# Patient Record
Sex: Male | Born: 1955 | Race: White | Hispanic: No | Marital: Single
Health system: Southern US, Community
[De-identification: ages and names within clinical notes are randomized; demographics above are authoritative.]

## PROBLEM LIST (undated history)

## (undated) DIAGNOSIS — N183 Chronic kidney disease, stage 3 unspecified: Secondary | ICD-10-CM

## (undated) DIAGNOSIS — J9621 Acute and chronic respiratory failure with hypoxia: Secondary | ICD-10-CM

## (undated) DIAGNOSIS — R6520 Severe sepsis without septic shock: Secondary | ICD-10-CM

## (undated) DIAGNOSIS — G9340 Encephalopathy, unspecified: Secondary | ICD-10-CM

## (undated) DIAGNOSIS — A419 Sepsis, unspecified organism: Secondary | ICD-10-CM

---

## 2019-05-14 ENCOUNTER — Inpatient Hospital Stay
Admit: 2019-05-14 | Discharge: 2019-06-29 | Disposition: E | Payer: Medicare Other | Source: Other Acute Inpatient Hospital | Attending: Internal Medicine | Admitting: Internal Medicine

## 2019-05-14 DIAGNOSIS — N183 Chronic kidney disease, stage 3 unspecified: Secondary | ICD-10-CM | POA: Diagnosis present

## 2019-05-14 DIAGNOSIS — Z4659 Encounter for fitting and adjustment of other gastrointestinal appliance and device: Secondary | ICD-10-CM

## 2019-05-14 DIAGNOSIS — Z9289 Personal history of other medical treatment: Secondary | ICD-10-CM

## 2019-05-14 DIAGNOSIS — J9621 Acute and chronic respiratory failure with hypoxia: Secondary | ICD-10-CM | POA: Diagnosis present

## 2019-05-14 DIAGNOSIS — R6520 Severe sepsis without septic shock: Secondary | ICD-10-CM | POA: Diagnosis present

## 2019-05-14 DIAGNOSIS — Z9911 Dependence on respirator [ventilator] status: Secondary | ICD-10-CM

## 2019-05-14 DIAGNOSIS — J189 Pneumonia, unspecified organism: Secondary | ICD-10-CM

## 2019-05-14 DIAGNOSIS — Z9889 Other specified postprocedural states: Secondary | ICD-10-CM

## 2019-05-14 DIAGNOSIS — N179 Acute kidney failure, unspecified: Secondary | ICD-10-CM

## 2019-05-14 DIAGNOSIS — G9340 Encephalopathy, unspecified: Secondary | ICD-10-CM | POA: Diagnosis present

## 2019-05-14 DIAGNOSIS — A419 Sepsis, unspecified organism: Secondary | ICD-10-CM | POA: Diagnosis present

## 2019-05-14 DIAGNOSIS — J111 Influenza due to unidentified influenza virus with other respiratory manifestations: Secondary | ICD-10-CM

## 2019-05-14 DIAGNOSIS — J9690 Respiratory failure, unspecified, unspecified whether with hypoxia or hypercapnia: Secondary | ICD-10-CM

## 2019-05-14 HISTORY — DX: Sepsis, unspecified organism: R65.20

## 2019-05-14 HISTORY — DX: Encephalopathy, unspecified: G93.40

## 2019-05-14 HISTORY — DX: Chronic kidney disease, stage 3 unspecified: N18.30

## 2019-05-14 HISTORY — DX: Sepsis, unspecified organism: A41.9

## 2019-05-14 HISTORY — DX: Acute and chronic respiratory failure with hypoxia: J96.21

## 2019-05-14 LAB — BLOOD GAS, ARTERIAL
Acid-base deficit: 2.9 mmol/L — ABNORMAL HIGH (ref 0.0–2.0)
Bicarbonate: 22 mmol/L (ref 20.0–28.0)
FIO2: 40
O2 Saturation: 98 %
Patient temperature: 37
pCO2 arterial: 41.8 mmHg (ref 32.0–48.0)
pH, Arterial: 7.341 — ABNORMAL LOW (ref 7.350–7.450)
pO2, Arterial: 109 mmHg — ABNORMAL HIGH (ref 83.0–108.0)

## 2019-05-15 ENCOUNTER — Other Ambulatory Visit (HOSPITAL_COMMUNITY): Payer: Medicare Other

## 2019-05-15 ENCOUNTER — Encounter: Payer: Self-pay | Admitting: Internal Medicine

## 2019-05-15 DIAGNOSIS — G9340 Encephalopathy, unspecified: Secondary | ICD-10-CM | POA: Diagnosis not present

## 2019-05-15 DIAGNOSIS — N183 Chronic kidney disease, stage 3 unspecified: Secondary | ICD-10-CM

## 2019-05-15 DIAGNOSIS — J9621 Acute and chronic respiratory failure with hypoxia: Secondary | ICD-10-CM | POA: Diagnosis not present

## 2019-05-15 DIAGNOSIS — R6520 Severe sepsis without septic shock: Secondary | ICD-10-CM

## 2019-05-15 DIAGNOSIS — A419 Sepsis, unspecified organism: Secondary | ICD-10-CM | POA: Diagnosis present

## 2019-05-15 LAB — COMPREHENSIVE METABOLIC PANEL
ALT: 24 U/L (ref 0–44)
AST: 27 U/L (ref 15–41)
Albumin: 1.8 g/dL — ABNORMAL LOW (ref 3.5–5.0)
Alkaline Phosphatase: 72 U/L (ref 38–126)
Anion gap: 9 (ref 5–15)
BUN: 61 mg/dL — ABNORMAL HIGH (ref 8–23)
CO2: 22 mmol/L (ref 22–32)
Calcium: 7.6 mg/dL — ABNORMAL LOW (ref 8.9–10.3)
Chloride: 110 mmol/L (ref 98–111)
Creatinine, Ser: 1.89 mg/dL — ABNORMAL HIGH (ref 0.61–1.24)
GFR calc Af Amer: 42 mL/min — ABNORMAL LOW (ref >60)
GFR calc non Af Amer: 36 mL/min — ABNORMAL LOW (ref >60)
Glucose, Bld: 65 mg/dL — ABNORMAL LOW (ref 70–99)
Potassium: 3.7 mmol/L (ref 3.5–5.1)
Sodium: 141 mmol/L (ref 135–145)
Total Bilirubin: 0.4 mg/dL (ref 0.3–1.2)
Total Protein: 5.4 g/dL — ABNORMAL LOW (ref 6.5–8.1)

## 2019-05-15 LAB — CBC
HCT: 28.2 % — ABNORMAL LOW (ref 39.0–52.0)
Hemoglobin: 8.9 g/dL — ABNORMAL LOW (ref 13.0–17.0)
MCH: 30.1 pg (ref 26.0–34.0)
MCHC: 31.6 g/dL (ref 30.0–36.0)
MCV: 95.3 fL (ref 80.0–100.0)
Platelets: 231 10*3/uL (ref 150–400)
RBC: 2.96 MIL/uL — ABNORMAL LOW (ref 4.22–5.81)
RDW: 16 % — ABNORMAL HIGH (ref 11.5–15.5)
WBC: 12.3 10*3/uL — ABNORMAL HIGH (ref 4.0–10.5)
nRBC: 0 % (ref 0.0–0.2)

## 2019-05-15 LAB — HEMOGLOBIN A1C
Hgb A1c MFr Bld: 9.5 % — ABNORMAL HIGH (ref 4.8–5.6)
Mean Plasma Glucose: 225.95 mg/dL

## 2019-05-15 MED ORDER — GENERIC EXTERNAL MEDICATION
2000.00 | Status: DC
Start: 2019-05-14 — End: 2019-05-15

## 2019-05-15 MED ORDER — ATORVASTATIN CALCIUM 40 MG PO TABS
40.00 | ORAL_TABLET | ORAL | Status: DC
Start: 2019-05-14 — End: 2019-05-15

## 2019-05-15 MED ORDER — ACETAMINOPHEN 160 MG/5ML PO SOLN
650.00 | ORAL | Status: DC
Start: 2019-05-14 — End: 2019-05-15

## 2019-05-15 MED ORDER — GLUCOSE-VITAMIN C 4-6 GM-MG PO CHEW
12.00 | CHEWABLE_TABLET | ORAL | Status: DC
Start: ? — End: 2019-05-15

## 2019-05-15 MED ORDER — SODIUM CHLORIDE 0.9 % IV SOLN
INTRAVENOUS | Status: DC
Start: ? — End: 2019-05-15

## 2019-05-15 MED ORDER — FERROUS SULFATE 325 (65 FE) MG PO TABS
325.00 | ORAL_TABLET | ORAL | Status: DC
Start: 2019-05-14 — End: 2019-05-15

## 2019-05-15 MED ORDER — INSULIN NPH (HUMAN) (ISOPHANE) 100 UNIT/ML ~~LOC~~ SUSP
40.00 | SUBCUTANEOUS | Status: DC
Start: 2019-05-14 — End: 2019-05-15

## 2019-05-15 MED ORDER — NICOTINE 7 MG/24HR TD PT24
1.00 | MEDICATED_PATCH | TRANSDERMAL | Status: DC
Start: 2019-05-15 — End: 2019-05-15

## 2019-05-15 MED ORDER — ACETAMINOPHEN 325 MG PO TABS
650.00 | ORAL_TABLET | ORAL | Status: DC
Start: ? — End: 2019-05-15

## 2019-05-15 MED ORDER — PROPOFOL 100 MG/10ML IV EMUL
5.00 | INTRAVENOUS | Status: DC
Start: ? — End: 2019-05-15

## 2019-05-15 MED ORDER — HYDRALAZINE HCL 25 MG PO TABS
25.00 | ORAL_TABLET | ORAL | Status: DC
Start: 2019-05-14 — End: 2019-05-15

## 2019-05-15 MED ORDER — CLINDAMYCIN PHOSPHATE IN D5W 600 MG/50ML IV SOLN
600.00 | INTRAVENOUS | Status: DC
Start: 2019-05-14 — End: 2019-05-15

## 2019-05-15 MED ORDER — GENERIC EXTERNAL MEDICATION
50.00 | Status: DC
Start: ? — End: 2019-05-15

## 2019-05-15 MED ORDER — GENERIC EXTERNAL MEDICATION
Status: DC
Start: ? — End: 2019-05-15

## 2019-05-15 MED ORDER — SENNOSIDES-DOCUSATE SODIUM 8.6-50 MG PO TABS
1.00 | ORAL_TABLET | ORAL | Status: DC
Start: 2019-05-14 — End: 2019-05-15

## 2019-05-15 MED ORDER — GENERIC EXTERNAL MEDICATION
5.00 | Status: DC
Start: ? — End: 2019-05-15

## 2019-05-15 MED ORDER — VECURONIUM BROMIDE 10 MG IV SOLR
10.00 | INTRAVENOUS | Status: DC
Start: ? — End: 2019-05-15

## 2019-05-15 MED ORDER — GENERIC EXTERNAL MEDICATION
4000.00 | Status: DC
Start: ? — End: 2019-05-15

## 2019-05-15 MED ORDER — SODIUM CHLORIDE 0.9 % IV SOLN
20.00 | INTRAVENOUS | Status: DC
Start: ? — End: 2019-05-15

## 2019-05-15 MED ORDER — GENERIC EXTERNAL MEDICATION
1.00 | Status: DC
Start: ? — End: 2019-05-15

## 2019-05-15 MED ORDER — ASPIRIN 81 MG PO CHEW
81.00 | CHEWABLE_TABLET | ORAL | Status: DC
Start: 2019-05-15 — End: 2019-05-15

## 2019-05-15 MED ORDER — ONDANSETRON HCL 4 MG/2ML IJ SOLN
4.00 | INTRAMUSCULAR | Status: DC
Start: ? — End: 2019-05-15

## 2019-05-15 MED ORDER — NOREPINEPHRINE 4 MG/250ML-% IV SOLN
0.02 | INTRAVENOUS | Status: DC
Start: ? — End: 2019-05-15

## 2019-05-15 MED ORDER — DSS 100 MG PO CAPS
100.00 | ORAL_CAPSULE | ORAL | Status: DC
Start: 2019-05-14 — End: 2019-05-15

## 2019-05-15 MED ORDER — ALUM & MAG HYDROXIDE-SIMETH 200-200-20 MG/5ML PO SUSP
30.00 | ORAL | Status: DC
Start: ? — End: 2019-05-15

## 2019-05-15 MED ORDER — PROMETHAZINE HCL 25 MG PO TABS
12.50 | ORAL_TABLET | ORAL | Status: DC
Start: ? — End: 2019-05-15

## 2019-05-15 MED ORDER — PRO-STAT PO LIQD
30.00 | ORAL | Status: DC
Start: 2019-05-14 — End: 2019-05-15

## 2019-05-15 MED ORDER — CLOPIDOGREL BISULFATE 75 MG PO TABS
75.00 | ORAL_TABLET | ORAL | Status: DC
Start: 2019-05-15 — End: 2019-05-15

## 2019-05-15 MED ORDER — IPRATROPIUM-ALBUTEROL 0.5-2.5 (3) MG/3ML IN SOLN
3.00 | RESPIRATORY_TRACT | Status: DC
Start: 2019-05-14 — End: 2019-05-15

## 2019-05-15 MED ORDER — METHYLPREDNISOLONE SODIUM SUCC 40 MG IJ SOLR
40.00 | INTRAMUSCULAR | Status: DC
Start: 2019-05-14 — End: 2019-05-15

## 2019-05-15 MED ORDER — HYDRALAZINE HCL 25 MG PO TABS
25.00 | ORAL_TABLET | ORAL | Status: DC
Start: ? — End: 2019-05-15

## 2019-05-15 MED ORDER — MULTI-VITAMIN PO TABS
1.00 | ORAL_TABLET | ORAL | Status: DC
Start: 2019-05-15 — End: 2019-05-15

## 2019-05-15 MED ORDER — ATROPINE SULFATE 0.5 MG/5ML IJ SOSY
0.50 | PREFILLED_SYRINGE | INTRAMUSCULAR | Status: DC
Start: ? — End: 2019-05-15

## 2019-05-15 MED ORDER — MAGNESIUM SULFATE 4 GM/100ML IV SOLN
4.00 | INTRAVENOUS | Status: DC
Start: ? — End: 2019-05-15

## 2019-05-15 MED ORDER — CHLORHEXIDINE GLUCONATE 0.12 % MT SOLN
15.00 | OROMUCOSAL | Status: DC
Start: 2019-05-14 — End: 2019-05-15

## 2019-05-15 MED ORDER — LACTATED RINGERS IV SOLN
500.00 | INTRAVENOUS | Status: DC
Start: ? — End: 2019-05-15

## 2019-05-15 MED ORDER — LORAZEPAM 2 MG/ML IJ SOLN
2.00 | INTRAMUSCULAR | Status: DC
Start: ? — End: 2019-05-15

## 2019-05-15 MED ORDER — IPRATROPIUM-ALBUTEROL 0.5-2.5 (3) MG/3ML IN SOLN
3.00 | RESPIRATORY_TRACT | Status: DC
Start: ? — End: 2019-05-15

## 2019-05-15 MED ORDER — ENOXAPARIN SODIUM 40 MG/0.4ML ~~LOC~~ SOLN
40.00 | SUBCUTANEOUS | Status: DC
Start: 2019-05-15 — End: 2019-05-15

## 2019-05-15 MED ORDER — EPINEPHRINE 1 MG/10ML IJ SOSY
1.00 | PREFILLED_SYRINGE | INTRAMUSCULAR | Status: DC
Start: ? — End: 2019-05-15

## 2019-05-15 MED ORDER — AMLODIPINE BESYLATE 5 MG PO TABS
5.00 | ORAL_TABLET | ORAL | Status: DC
Start: 2019-05-15 — End: 2019-05-15

## 2019-05-15 MED ORDER — FENTANYL CITRATE (PF) 50 MCG/ML IJ SOLN
100.00 | INTRAMUSCULAR | Status: DC
Start: ? — End: 2019-05-15

## 2019-05-15 MED ORDER — PANTOPRAZOLE SODIUM 40 MG IV SOLR
40.00 | INTRAVENOUS | Status: DC
Start: 2019-05-15 — End: 2019-05-15

## 2019-05-15 MED ORDER — DEXTROSE 50 % IV SOLN
25.00 | INTRAVENOUS | Status: DC
Start: ? — End: 2019-05-15

## 2019-05-15 MED ORDER — COLLAGENASE 250 UNIT/GM EX OINT
TOPICAL_OINTMENT | CUTANEOUS | Status: DC
Start: 2019-05-15 — End: 2019-05-15

## 2019-05-15 MED ORDER — L-LYSINE EX OINT
TOPICAL_OINTMENT | CUTANEOUS | Status: DC
Start: ? — End: 2019-05-15

## 2019-05-15 MED ORDER — CARBOXYMETHYLCELLULOSE SODIUM 0.5 % OP SOLN
2.00 | OPHTHALMIC | Status: DC
Start: 2019-05-14 — End: 2019-05-15

## 2019-05-15 MED ORDER — INSULIN ASPART 100 UNIT/ML FLEXPEN
0.00 | PEN_INJECTOR | SUBCUTANEOUS | Status: DC
Start: 2019-05-14 — End: 2019-05-15

## 2019-05-15 NOTE — Consult Note (Signed)
Pulmonary [place]  Date of Service: 05/15/2019  PULMONARY CRITICAL CARE CONSULT   WM SAHAGUN  MGQ:[id number]  DOB: 05-15-1956   DOA: 2019-05-25  Referring Physician: Merton Border, MD  HPI: James Merritt is a 63 y.o. male seen for follow up of Acute on Chronic Respiratory Failure.  Patient has multiple problems including anxiety disorder COPD diabetes GERD gout chronic renal failure stage III hepatitis C presented to the hospital because of pain and swelling in the hands.  Patient had a long complicated course at the other facility basically was found to have possibly subcutaneous abscess.  There was significant edema noted.  He was treated for upper extremity cellulitis with antibiotics.  Given vancomycin and clindamycin.  Patient required about 4 to 6 weeks of antibiotics and is still on antibiotics because of high risk of osteomyelitis.  Patient ended up being intubated on December 14 because of respiratory failure it was felt to be secondary to aspiration.  Apparently FiO2 was improved and his oxygen went from 100% down to 40%.  Patient is now transferred to our facility for further management currently is on pressure support mode has been on 40% FiO2 with pressure support 12/5 and is orally intubated.  Review of Systems:  ROS performed and is unremarkable other than noted above.  PAST MEDICAL HISTORY Past Medical History:  Diagnosis Date  . Anxiety  . Chronic renal impairment, stage 3 (moderate) ([place]) 12/14/2017  . COPD (chronic obstructive pulmonary disease) ([place])  . Diabetes mellitus ([place])  . GERD (gastroesophageal reflux disease)  . Gout  . Hepatitis C antibody test positive 01/06/2019  04/19/2018  . 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 Gated images reveal LVEF = 32%. This represents a abnormal functional finding. This is a high risk study due to LVEF   . Hypertension  . Seizures ([place])    PAST SURGICAL HISTORY Past Surgical History:  Procedure Laterality Date  . HX CORONARY ARTERY BYPASS GRAFT 11/2017  . HX FOOT SURGERY  . HX HAND LIGAMENT RECONSTRUCTION  . HX KNEE CARTILAGE SURGERY  . HX PTCA   ALLERGIES Allergies  Allergen Reactions  . Clonidine Unknown  Other reaction(s): Unknown Pt denies allergy Pt denies allergy  . Methadone Unknown  Other reaction(s): Unknown Pt denies allergy Pt denies allergy     Medications: Reviewed on Rounds  Physical Exam:  Vitals: Temperature 96.1 pulse 56 respiratory rate 14 blood pressure 141/61 saturations 100%  Ventilator Settings patient is orally intubated critically ill right now is on pressure support FiO2 40% tidal line 580 pressure poor 12 PEEP 5  . General: Comfortable at this time . Eyes: Grossly normal lids, irises & conjunctiva . ENT: grossly tongue is normal . Neck: no obvious mass . Cardiovascular: S1-S2 normal no gallop or rub . Respiratory: Scattered coarse breath sounds are noted bilaterally . Abdomen: Soft and nontender at this time . Skin: no rash seen on limited exam . Musculoskeletal: not rigid . Psychiatric:unable to assess . Neurologic: no seizure no involuntary movements         Labs on Admission:  Basic Metabolic Panel: Recent Labs  Lab 05/15/19 0031  NA 141  K 3.7  CL 110  CO2 22  GLUCOSE 65*  BUN 61*  CREATININE 1.89*  CALCIUM  7.6*    Recent Labs  Lab 04/28/2019 2235  PHART 7.341*  PCO2ART 41.8  PO2ART 109*  HCO3 22.0  O2SAT 98.0    Liver Function Tests: Recent Labs  Lab 05/15/19 0031  AST 27  ALT 24  ALKPHOS 72  BILITOT 0.4  PROT 5.4*  ALBUMIN 1.8*   No results for input(s): LIPASE, AMYLASE in the last 168 hours. No results for input(s): AMMONIA in the last 168  hours.  CBC: Recent Labs  Lab 05/15/19 0031  WBC 12.3*  HGB 8.9*  HCT 28.2*  MCV 95.3  PLT 231    Cardiac Enzymes: No results for input(s): CKTOTAL, CKMB, CKMBINDEX, TROPONINI in the last 168 hours.  BNP (last 3 results) No results for input(s): BNP in the last 8760 hours.  ProBNP (last 3 results) No results for input(s): PROBNP in the last 8760 hours.   Radiological Exams on Admission: DG Chest Port 1 View  Result Date: 05/15/2019 CLINICAL DATA:  Pneumonia. NG tube placement. EXAM: PORTABLE CHEST 1 VIEW COMPARISON:  None. FINDINGS: Endotracheal tube tip 3.8 cm from the carina. Enteric tube in place with tip below the diaphragm not included in the field of view. Post median sternotomy. Upper normal heart size. Hazy bilateral lung base opacities likely combination of pleural effusions and airspace disease. Focal opacity in the left mid lung. No pneumothorax. Remote right clavicle fracture. Multiple overlying monitoring devices. IMPRESSION: 1. Endotracheal tube tip 3.8 cm from the carina. 2. Enteric tube in place with tip below the diaphragm not included in the field of view. 3. Hazy bilateral lung base opacities likely combination of pleural effusions and airspace disease. Additional focal opacity in the left mid lung suspicious for pneumonia. Electronically Signed   By: Narda Rutherford M.D.   On: 05/15/2019 01:12   DG Abd Portable 1V  Result Date: 05/15/2019 CLINICAL DATA:  NG tube placement. EXAM: PORTABLE ABDOMEN - 1 VIEW COMPARISON:  None. FINDINGS: Tip of the weighted enteric tube in the right upper quadrant likely in the first portion of the duodenum. Nonobstructive bowel gas pattern. Small amount of air in nondilated colon. IMPRESSION: Tip of the weighted enteric tube in the right upper quadrant likely in the first portion of the duodenum. Electronically Signed   By: Narda Rutherford M.D.   On: 05/15/2019 01:13    Assessment/Plan Active Problems:   Acute on chronic  respiratory failure with hypoxia (HCC)   Severe sepsis (HCC)   Chronic kidney disease, stage III (moderate)   Acute encephalopathy   1. Acute on chronic respiratory failure hypoxia at this time patient is on the wean protocol on pressure support 12/5.  He was just recently intubated looks like on the 14th so has been on the ventilator for 4 days hopefully we can try to aggressively wean and possibly extubate otherwise if he fails will more than likely need a tracheostomy. 2. Severe sepsis patient apparently had positive blood cultures noted to steroids has been treated patient was on vancomycin clindamycin and will be needing a prolonged course of antibiotics. 3. Acute renal failure patient apparently has a baseline stage III chronic renal failure and his renal function was 3.3 upon admission at the other facility currently creatinine is 1.89 we are going to monitor the fluid status closely and make certain that there is no worsening of the renal function. 4. Acute encephalopathy unclear etiology patient does have issues with substance abuse in the past so we will need to monitor closely for any withdrawal  seizures etc. presented  I have personally seen and evaluated the patient, evaluated laboratory and imaging results, formulated the assessment and plan and placed orders.  Patient is critically ill in danger of cardiac arrest and death The Patient requires high complexity decision making for assessment and support.  Case was discussed on Rounds with the Respiratory Therapy Staff Time Spent 70minutes  Yevonne PaxSaadat A Jatavion Peaster, MD [facility] Pulmonary Critical Care Medicine Sleep Medicine

## 2019-05-16 DIAGNOSIS — N183 Chronic kidney disease, stage 3 unspecified: Secondary | ICD-10-CM | POA: Diagnosis not present

## 2019-05-16 DIAGNOSIS — A419 Sepsis, unspecified organism: Secondary | ICD-10-CM | POA: Diagnosis not present

## 2019-05-16 DIAGNOSIS — G9340 Encephalopathy, unspecified: Secondary | ICD-10-CM | POA: Diagnosis not present

## 2019-05-16 DIAGNOSIS — J9621 Acute and chronic respiratory failure with hypoxia: Secondary | ICD-10-CM | POA: Diagnosis not present

## 2019-05-16 LAB — HEMOGLOBIN A1C
Hgb A1c MFr Bld: 9.3 % — ABNORMAL HIGH (ref 4.8–5.6)
Mean Plasma Glucose: 220.21 mg/dL

## 2019-05-16 LAB — BASIC METABOLIC PANEL
Anion gap: 10 (ref 5–15)
BUN: 60 mg/dL — ABNORMAL HIGH (ref 8–23)
CO2: 21 mmol/L — ABNORMAL LOW (ref 22–32)
Calcium: 7.7 mg/dL — ABNORMAL LOW (ref 8.9–10.3)
Chloride: 109 mmol/L (ref 98–111)
Creatinine, Ser: 1.73 mg/dL — ABNORMAL HIGH (ref 0.61–1.24)
GFR calc Af Amer: 47 mL/min — ABNORMAL LOW (ref >60)
GFR calc non Af Amer: 41 mL/min — ABNORMAL LOW (ref >60)
Glucose, Bld: 195 mg/dL — ABNORMAL HIGH (ref 70–99)
Potassium: 3.6 mmol/L (ref 3.5–5.1)
Sodium: 140 mmol/L (ref 135–145)

## 2019-05-16 LAB — CBC
HCT: 29.4 % — ABNORMAL LOW (ref 39.0–52.0)
Hemoglobin: 9.3 g/dL — ABNORMAL LOW (ref 13.0–17.0)
MCH: 30.2 pg (ref 26.0–34.0)
MCHC: 31.6 g/dL (ref 30.0–36.0)
MCV: 95.5 fL (ref 80.0–100.0)
Platelets: 166 10*3/uL (ref 150–400)
RBC: 3.08 MIL/uL — ABNORMAL LOW (ref 4.22–5.81)
RDW: 16 % — ABNORMAL HIGH (ref 11.5–15.5)
WBC: 8 10*3/uL (ref 4.0–10.5)
nRBC: 0 % (ref 0.0–0.2)

## 2019-05-16 NOTE — Progress Notes (Addendum)
Pulmonary Critical Care Medicine [place]   PULMONARY CRITICAL CARE SERVICE  PROGRESS NOTE  Date of Service: 05/16/2019  James Merritt  YWV:[id number]  DOB: 12-Jul-1955   DOA: 05/04/2019  Referring Physician: Merton Border, MD  HPI: James Merritt is a 63 y.o. male seen for follow up of Acute on Chronic Respiratory Failure.  Patient remains critically ill orally intubated he was started on a pressure support mode this morning appears to be tolerating it so far we will see how he does through the day.  Medications: Reviewed on Rounds  Physical Exam:  Vitals: Temperature 97.1 pulse 71 respiratory 22 blood pressure 163/83 saturations 98%  Ventilator Settings mode of ventilation pressure support FiO2 35% tidal volume is 528 pressure poor 12 PEEP 5  . General: Comfortable at this time . Eyes: Grossly normal lids, irises & conjunctiva . ENT: grossly tongue is normal . Neck: no obvious mass . Cardiovascular: S1 S2 normal no gallop . Respiratory: Coarse breath sounds are noted bilaterally . Abdomen: soft . Skin: no rash seen on limited exam . Musculoskeletal: not rigid . Psychiatric:unable to assess . Neurologic: no seizure no involuntary movements         Lab Data:   Basic Metabolic Panel: Recent Labs  Lab 05/15/19 0031 05/16/19 0531  NA 141 140  K 3.7 3.6  CL 110 109  CO2 22 21*  GLUCOSE 65* 195*  BUN 61* 60*  CREATININE 1.89* 1.73*  CALCIUM 7.6* 7.7*    ABG: Recent Labs  Lab 05/25/2019 2235  PHART 7.341*  PCO2ART 41.8  PO2ART 109*  HCO3 22.0  O2SAT 98.0    Liver Function Tests: Recent Labs  Lab 05/15/19 0031  AST 27  ALT 24  ALKPHOS 72  BILITOT 0.4  PROT 5.4*  ALBUMIN 1.8*   No results for input(s): LIPASE, AMYLASE in the last 168 hours. No results for input(s): AMMONIA in the last 168 hours.  CBC: Recent Labs  Lab 05/15/19 0031 05/16/19 0531  WBC 12.3* 8.0  HGB 8.9* 9.3*  HCT 28.2* 29.4*  MCV 95.3 95.5  PLT 231  166    Cardiac Enzymes: No results for input(s): CKTOTAL, CKMB, CKMBINDEX, TROPONINI in the last 168 hours.  BNP (last 3 results) No results for input(s): BNP in the last 8760 hours.  ProBNP (last 3 results) No results for input(s): PROBNP in the last 8760 hours.  Radiological Exams: DG Chest Port 1 View  Result Date: 05/15/2019 CLINICAL DATA:  Pneumonia. NG tube placement. EXAM: PORTABLE CHEST 1 VIEW COMPARISON:  None. FINDINGS: Endotracheal tube tip 3.8 cm from the carina. Enteric tube in place with tip below the diaphragm not included in the field of view. Post median sternotomy. Upper normal heart size. Hazy bilateral lung base opacities likely combination of pleural effusions and airspace disease. Focal opacity in the left mid lung. No pneumothorax. Remote right clavicle fracture. Multiple overlying monitoring devices. IMPRESSION: 1. Endotracheal tube tip 3.8 cm from the carina. 2. Enteric tube in place with tip below the diaphragm not included in the field of view. 3. Hazy bilateral lung base opacities likely combination of pleural effusions and airspace disease. Additional focal opacity in the left mid lung suspicious for pneumonia. Electronically Signed   By: Keith Rake M.D.   On: 05/15/2019 01:12   DG Abd Portable 1V  Result Date: 05/15/2019 CLINICAL DATA:  NG tube placement. EXAM: PORTABLE ABDOMEN - 1 VIEW COMPARISON:  None. FINDINGS: Tip of the weighted enteric tube  in the right upper quadrant likely in the first portion of the duodenum. Nonobstructive bowel gas pattern. Small amount of air in nondilated colon. IMPRESSION: Tip of the weighted enteric tube in the right upper quadrant likely in the first portion of the duodenum. Electronically Signed   By: Narda Rutherford M.D.   On: 05/15/2019 01:13    Assessment/Plan Active Problems:   Acute on chronic respiratory failure with hypoxia (HCC)   Severe sepsis (HCC)   Chronic kidney disease, stage III (moderate)   Acute  encephalopathy   1. Acute on chronic respiratory failure with hypoxia patient is on the wean protocol right now on 12/5 pressure support remains orally intubated.  I am hopeful that we can try to wean him aggressively and try to extubate.  If patient's not able to tolerate then will need a tracheostomy. 2. Severe sepsis right now patient's hemodynamics are stable has completed antibiotics at the other facility.  We will continue to monitor for any fever spikes 3. Chronic kidney disease stage III labs currently showing creatinine coming down to 1.7 continue to monitor fluid status closely 4. Acute encephalopathy no changes are noted at this time we will continue with supportive care   I have personally seen and evaluated the patient, evaluated laboratory and imaging results, formulated the assessment and plan and placed orders.  Patient remains critically ill in danger of cardiac arrest and death time 35 minutes The Patient requires high complexity decision making for assessment and support.  Case was discussed on Rounds with the Respiratory Therapy Staff  Yevonne Pax, MD [facility] Pulmonary Critical Care Medicine Sleep Medicine

## 2019-05-17 ENCOUNTER — Other Ambulatory Visit (HOSPITAL_COMMUNITY): Payer: Medicare Other

## 2019-05-17 DIAGNOSIS — G9340 Encephalopathy, unspecified: Secondary | ICD-10-CM | POA: Diagnosis not present

## 2019-05-17 DIAGNOSIS — N183 Chronic kidney disease, stage 3 unspecified: Secondary | ICD-10-CM | POA: Diagnosis not present

## 2019-05-17 DIAGNOSIS — J9621 Acute and chronic respiratory failure with hypoxia: Secondary | ICD-10-CM | POA: Diagnosis not present

## 2019-05-17 DIAGNOSIS — A419 Sepsis, unspecified organism: Secondary | ICD-10-CM | POA: Diagnosis not present

## 2019-05-17 LAB — MAGNESIUM: Magnesium: 2.4 mg/dL (ref 1.7–2.4)

## 2019-05-17 LAB — CBC
HCT: 29.4 % — ABNORMAL LOW (ref 39.0–52.0)
Hemoglobin: 9.4 g/dL — ABNORMAL LOW (ref 13.0–17.0)
MCH: 30 pg (ref 26.0–34.0)
MCHC: 32 g/dL (ref 30.0–36.0)
MCV: 93.9 fL (ref 80.0–100.0)
Platelets: 143 10*3/uL — ABNORMAL LOW (ref 150–400)
RBC: 3.13 MIL/uL — ABNORMAL LOW (ref 4.22–5.81)
RDW: 15.9 % — ABNORMAL HIGH (ref 11.5–15.5)
WBC: 9.6 10*3/uL (ref 4.0–10.5)
nRBC: 0 % (ref 0.0–0.2)

## 2019-05-17 LAB — TROPONIN I (HIGH SENSITIVITY)
Troponin I (High Sensitivity): 19 ng/L — ABNORMAL HIGH (ref <18)
Troponin I (High Sensitivity): 20 ng/L — ABNORMAL HIGH (ref <18)

## 2019-05-17 LAB — BASIC METABOLIC PANEL
Anion gap: 9 (ref 5–15)
BUN: 59 mg/dL — ABNORMAL HIGH (ref 8–23)
CO2: 20 mmol/L — ABNORMAL LOW (ref 22–32)
Calcium: 7.7 mg/dL — ABNORMAL LOW (ref 8.9–10.3)
Chloride: 111 mmol/L (ref 98–111)
Creatinine, Ser: 1.52 mg/dL — ABNORMAL HIGH (ref 0.61–1.24)
GFR calc Af Amer: 55 mL/min — ABNORMAL LOW (ref >60)
GFR calc non Af Amer: 47 mL/min — ABNORMAL LOW (ref >60)
Glucose, Bld: 298 mg/dL — ABNORMAL HIGH (ref 70–99)
Potassium: 4.4 mmol/L (ref 3.5–5.1)
Sodium: 140 mmol/L (ref 135–145)

## 2019-05-17 MED ORDER — GENERIC EXTERNAL MEDICATION
Status: DC
Start: ? — End: 2019-05-17

## 2019-05-17 NOTE — Progress Notes (Signed)
Pulmonary Critical Care Medicine [place]   PULMONARY CRITICAL CARE SERVICE  PROGRESS NOTE  Date of Service: 05/17/2019  James Merritt  [id number]  DOB: 01-18-1956   DOA: 05/12/2019  Referring Physician: Merton Border, MD  HPI: James Merritt is a 63 y.o. male seen for follow up of Acute on Chronic Respiratory Failure.  Patient currently is on pressure support mode has been on 30% FiO2 with good tidal volumes.  Right now is on 12/5  Medications: Reviewed on Rounds  Physical Exam:  Vitals: Temperature 96.8 pulse 57 respiratory 11 blood pressure 123/49 saturations 98%  Ventilator Settings mode of ventilation pressure support FiO2 30% tidal volume 516 pressure poor 12 PEEP 5  . General: Comfortable at this time . Eyes: Grossly normal lids, irises & conjunctiva . ENT: grossly tongue is normal . Neck: no obvious mass . Cardiovascular: S1 S2 normal no gallop . Respiratory: No rhonchi coarse breath sounds are noted . Abdomen: soft . Skin: no rash seen on limited exam . Musculoskeletal: not rigid . Psychiatric:unable to assess . Neurologic: no seizure no involuntary movements         Lab Data:   Basic Metabolic Panel: Recent Labs  Lab 05/15/19 0031 05/16/19 0531  NA 141 140  K 3.7 3.6  CL 110 109  CO2 22 21*  GLUCOSE 65* 195*  BUN 61* 60*  CREATININE 1.89* 1.73*  CALCIUM 7.6* 7.7*    ABG: Recent Labs  Lab 05/22/2019 2235  PHART 7.341*  PCO2ART 41.8  PO2ART 109*  HCO3 22.0  O2SAT 98.0    Liver Function Tests: Recent Labs  Lab 05/15/19 0031  AST 27  ALT 24  ALKPHOS 72  BILITOT 0.4  PROT 5.4*  ALBUMIN 1.8*   No results for input(s): LIPASE, AMYLASE in the last 168 hours. No results for input(s): AMMONIA in the last 168 hours.  CBC: Recent Labs  Lab 05/15/19 0031 05/16/19 0531  WBC 12.3* 8.0  HGB 8.9* 9.3*  HCT 28.2* 29.4*  MCV 95.3 95.5  PLT 231 166    Cardiac Enzymes: No results for input(s): CKTOTAL, CKMB,  CKMBINDEX, TROPONINI in the last 168 hours.  BNP (last 3 results) No results for input(s): BNP in the last 8760 hours.  ProBNP (last 3 results) No results for input(s): PROBNP in the last 8760 hours.  Radiological Exams: No results found.  Assessment/Plan Active Problems:   Acute on chronic respiratory failure with hypoxia (HCC)   Severe sepsis (HCC)   Chronic kidney disease, stage III (moderate)   Acute encephalopathy   1. Acute on chronic respiratory failure hypoxia plan to continue to wean on pressure support as tolerated 2. Severe sepsis resolved 3. Chronic kidney disease stage III at baseline 4. Acute encephalopathy no changes are noted    I have personally seen and evaluated the patient, evaluated laboratory and imaging results, formulated the assessment and plan and placed orders. The Patient requires high complexity decision making for assessment and support.  Case was discussed on Rounds with the Respiratory Therapy Staff  Allyne Gee, MD [facility] Pulmonary Critical Care Medicine Sleep Medicine

## 2019-05-17 NOTE — Consult Note (Signed)
Referring Physician: Dr. Arnold Long, MD  James Merritt is an 63 y.o. male.                       Chief Complaint: Cardiac arrest/Severe sinus bradycardia  HPI: 63 years old male with PMH of acute on chronic respiratory failure with hypoxemia, Acute encephalopathy, Severe sepsis, CAD with RCA stent 07/2017, CABG x 3 (LIMA to LAD, SVG to diagonal and SVG to Ramus intermedius of LCx) in 11/2017, Hepatitis C, COPD, GERD, upper extremity cellulitis,  and CKD III had episodes of severe sinus bradycardia responding to IV atropine x one. Patient nodes head for chest pain. EKG is showing anterolateral wall ischemia with possible anteroseptal MI. He is on aspirin and Plavix along with carvedilol. He has good urine output from IV lasix given earlier in the day. His electrolytes and WBC and Platelets count are normal this AM. HS-Troponin-I levels are pending.  Past Medical History:  Diagnosis Date  . Acute encephalopathy   . Acute on chronic respiratory failure with hypoxia ([place])   . Chronic kidney disease, stage III (moderate)   . Severe sepsis [facility])     Surgery: CABG-11/2017 and RCA stent -07/2017..  Family history : Non-contributory.  Social History:  has history of heavy tobacco use, possible alcohol, and drug use.  Allergies: Clonidine and Methadone, reaction unknown  No medications prior to admission.    Results for orders placed or performed during the hospital encounter of May 16, 2019 (from the past 48 hour(s))  Hemoglobin A1c     Status: Abnormal   Collection Time: 05/16/19  5:31 AM  Result Value Ref Range   Hgb A1c MFr Bld 9.3 (H) 4.8 - 5.6 %    Comment: (NOTE) Pre diabetes:          5.7%-6.4% Diabetes:              >6.4% Glycemic control for   <7.0% adults with diabetes    Mean Plasma Glucose 220.21 mg/dL    Comment: Performed at [facility], [place] [address], [place], [place] [address]  CBC     Status: Abnormal   Collection Time: 05/16/19  5:31 AM  Result Value Ref Range   WBC 8.0 4.0 - 10.5 K/uL   RBC 3.08 (L) 4.22 - 5.81 MIL/uL   Hemoglobin 9.3 (L) 13.0 - 17.0 g/dL   HCT 29.4 (L) 39.0 - 52.0 %   MCV 95.5 80.0 - 100.0 fL   MCH 30.2 26.0 - 34.0 pg   MCHC 31.6 30.0 - 36.0 g/dL   RDW 16.0 (H) 11.5 - 15.5 %   Platelets 166 150 - 400 K/uL   nRBC 0.0 0.0 - 0.2 %    Comment: Performed at [facility], [place] [address], [place], [place] [address]  Basic metabolic panel     Status: Abnormal   Collection Time: 05/16/19  5:31 AM  Result Value Ref Range   Sodium 140 135 - 145 mmol/L   Potassium 3.6 3.5 - 5.1 mmol/L   Chloride 109 98 - 111 mmol/L   CO2 21 (L) 22 - 32 mmol/L   Glucose, Bld 195 (H) 70 - 99 mg/dL   BUN 60 (H) 8 - 23 mg/dL   Creatinine, Ser 1.73 (H) 0.61 - 1.24 mg/dL   Calcium 7.7 (L) 8.9 - 10.3 mg/dL   GFR calc non Af Amer 41 (L) >60 mL/min   GFR calc Af Amer 47 (L) >60 mL/min  Anion gap 10 5 - 15    Comment: Performed at [facility], [address] [place], [place] [address]   DG CHEST PORT 1 VIEW  Result Date: 05/17/2019 CLINICAL DATA:  Acute respiratory failure. Endotracheally intubated. EXAM: PORTABLE CHEST 1 VIEW COMPARISON:  05/15/2019 FINDINGS: Endotracheal tube and feeding tube remain in place. A new right arm PICC line is seen with tip overlying the distal SVC. Heart size remains stable and within normal limits. Bilateral diffuse interstitial and airspace disease shows no significant change. Small to moderate layering right pleural effusion and bibasilar atelectasis are also stable. Decreased atelectasis or infiltrate seen in the left upper lobe since prior exam. IMPRESSION: New right arm PICC line in appropriate position. Decreased atelectasis or infiltrate in left upper lobe. No significant change in bilateral diffuse interstitial and airspace disease, bibasilar atelectasis, and right pleural effusion. Electronically Signed   By: Danae Orleans M.D.   On: 05/17/2019 16:56    Review Of Systems Constitutional: Positive fever,  chills, weight loss or gain. Eyes: No vision change, wears glasses. No discharge or pain. Ears: No hearing loss, No tinnitus. Respiratory: No asthma, positive COPD, pneumonias, shortness of breath. No hemoptysis. Cardiovascular: Positive chest pain, palpitation, leg edema. Gastrointestinal: Positive nausea, vomiting, diarrhea, constipation. No GI bleed. No hepatitis. Genitourinary: No dysuria, hematuria, kidney stone. No incontinance. Positive CKD. Neurological: No headache, stroke, seizures.  Psychiatry: No psych facility admission for anxiety, depression, suicide. No detox. Skin: No rash. Musculoskeletal: Positive joint pain, fibromyalgia. No neck pain, back pain. Lymphadenopathy: No lymphadenopathy. Hematology: No anemia or easy bruising.  T- 96.8 F, P- 70 to 90 post atropine, Resp: 30, BP 180/70 post fluid bolus. O2 sat 96-98 %. There is no height or weight on file to calculate BMI. General appearance: alert, cooperative, appears stated age and moderate to severe distress. Intubated with ventilator at 30 % Head: Normocephalic, atraumatic. Eyes: Blue eyes, pale conjunctiva, corneas clear Sclera- neg for icterus. Neck: No adenopathy, no carotid bruit, no JVD, supple, symmetrical, trachea midline and thyroid not enlarged. Resp: Basal crackles to auscultation bilaterally. Cardio: Regular rate and rhythm, S1, S2 normal, II/VI systolic murmur, no click, rub or gallop GI: Soft, non-tender; bowel sounds normal; no organomegaly. Extremities: 2 + edema, no cyanosis or clubbing. Skin: Warm and dry.  Neurologic: Alert and oriented X 1, Decreased strength.   Assessment/Plan Acute coronary syndrome R/O MI Severe sinus bradycardia improves with IV atropine use Acute left heart systolic failure Acute on chronic respiratory failure with hypoxemia Acute encephalopathy Sepsis Obesity COPD Anemia of chronic blood loss and chronic disease  Hold Carvedilol and antihypertensives. IV fluid bolus,  given. IV atropine if needed again. Start IV heparin if troponin are suggestive of MI Continue Aspirin and Plavix. Echocardiogram for LV function. He is not a candidate for cardiac interventions due to multiple morbid conditions.  Time spent: Review of old records, Lab, x-rays, EKG, other cardiac tests, examination, discussion with patient's doctors and nurse over 70 minutes.  Ricki Rodriguez, MD  05/17/2019, 4:59 PM

## 2019-05-18 ENCOUNTER — Other Ambulatory Visit (HOSPITAL_COMMUNITY): Payer: Medicare Other

## 2019-05-18 DIAGNOSIS — J9621 Acute and chronic respiratory failure with hypoxia: Secondary | ICD-10-CM | POA: Diagnosis not present

## 2019-05-18 DIAGNOSIS — N183 Chronic kidney disease, stage 3 unspecified: Secondary | ICD-10-CM | POA: Diagnosis not present

## 2019-05-18 DIAGNOSIS — A419 Sepsis, unspecified organism: Secondary | ICD-10-CM | POA: Diagnosis not present

## 2019-05-18 DIAGNOSIS — G9340 Encephalopathy, unspecified: Secondary | ICD-10-CM | POA: Diagnosis not present

## 2019-05-18 LAB — TROPONIN I (HIGH SENSITIVITY): Troponin I (High Sensitivity): 26 ng/L — ABNORMAL HIGH (ref <18)

## 2019-05-18 MED ORDER — GENERIC EXTERNAL MEDICATION
Status: DC
Start: ? — End: 2019-05-18

## 2019-05-18 NOTE — Progress Notes (Signed)
Pulmonary Critical Care Medicine [place]   PULMONARY CRITICAL CARE SERVICE  PROGRESS NOTE  Date of Service: 05/18/2019  James Merritt  DEY:[id number]  DOB: [birth year]   DOA: 05/27/2019  Referring Physician: Merton Border, MD  HPI: James Merritt is a 63 y.o. male seen for follow up of Acute on Chronic Respiratory Failure.  Patient currently is on full support on the ventilator on assist control mode orally intubated.  Still having some issues with agitation and confusion  Medications: Reviewed on Rounds  Physical Exam:  Vitals: Temperature 96.0 pulse 72 respiratory rate 10 blood pressure 105/82 saturations 97%  Ventilator Settings mode ventilation assist control FiO2 30% tidal volume 640 PEEP 5  . General: Comfortable at this time . Eyes: Grossly normal lids, irises & conjunctiva . ENT: grossly tongue is normal . Neck: no obvious mass . Cardiovascular: S1 S2 normal no gallop . Respiratory: Coarse rhonchi expansion is equal . Abdomen: soft . Skin: no rash seen on limited exam . Musculoskeletal: not rigid . Psychiatric:unable to assess . Neurologic: no seizure no involuntary movements         Lab Data:   Basic Metabolic Panel: Recent Labs  Lab 05/15/19 0031 05/16/19 0531 05/17/19 1625  NA 141 140 140  K 3.7 3.6 4.4  CL 110 109 111  CO2 22 21* 20*  GLUCOSE 65* 195* 298*  BUN 61* 60* 59*  CREATININE 1.89* 1.73* 1.52*  CALCIUM 7.6* 7.7* 7.7*  MG  --   --  2.4    ABG: Recent Labs  Lab 05/09/2019 2235  PHART 7.341*  PCO2ART 41.8  PO2ART 109*  HCO3 22.0  O2SAT 98.0    Liver Function Tests: Recent Labs  Lab 05/15/19 0031  AST 27  ALT 24  ALKPHOS 72  BILITOT 0.4  PROT 5.4*  ALBUMIN 1.8*   No results for input(s): LIPASE, AMYLASE in the last 168 hours. No results for input(s): AMMONIA in the last 168 hours.  CBC: Recent Labs  Lab 05/15/19 0031 05/16/19 0531 05/17/19 1625  WBC 12.3* 8.0 9.6  HGB 8.9* 9.3* 9.4*  HCT  28.2* 29.4* 29.4*  MCV 95.3 95.5 93.9  PLT 231 166 143*    Cardiac Enzymes: No results for input(s): CKTOTAL, CKMB, CKMBINDEX, TROPONINI in the last 168 hours.  BNP (last 3 results) No results for input(s): BNP in the last 8760 hours.  ProBNP (last 3 results) No results for input(s): PROBNP in the last 8760 hours.  Radiological Exams: DG CHEST PORT 1 VIEW  Result Date: 05/17/2019 CLINICAL DATA:  Acute respiratory failure. Endotracheally intubated. EXAM: PORTABLE CHEST 1 VIEW COMPARISON:  05/15/2019 FINDINGS: Endotracheal tube and feeding tube remain in place. A new right arm PICC line is seen with tip overlying the distal SVC. Heart size remains stable and within normal limits. Bilateral diffuse interstitial and airspace disease shows no significant change. Small to moderate layering right pleural effusion and bibasilar atelectasis are also stable. Decreased atelectasis or infiltrate seen in the left upper lobe since prior exam. IMPRESSION: New right arm PICC line in appropriate position. Decreased atelectasis or infiltrate in left upper lobe. No significant change in bilateral diffuse interstitial and airspace disease, bibasilar atelectasis, and right pleural effusion. Electronically Signed   By: Marlaine Hind M.D.   On: 05/17/2019 16:56    Assessment/Plan Active Problems:   Acute on chronic respiratory failure with hypoxia (HCC)   Severe sepsis (HCC)   Chronic kidney disease, stage III (moderate)   Acute  encephalopathy   1. Acute on chronic respiratory failure hypoxia plan is to continue with full support on the vent on assist control mode currently on 30% FiO2 2. Severe sepsis resolving hemodynamics are stable however patient has been having some issues with bradycardia consultation with cardiology was requested 3. Chronic kidney disease stage III follow-up labs 4. Acute encephalopathy no changes are noted at this time sedation is off   I have personally seen and evaluated the  patient, evaluated laboratory and imaging results, formulated the assessment and plan and placed orders. The Patient requires high complexity decision making for assessment and support.  Case was discussed on Rounds with the Respiratory Therapy Staff  Yevonne Pax, MD [facility] Pulmonary Critical Care Medicine Sleep Medicine

## 2019-05-19 ENCOUNTER — Other Ambulatory Visit (HOSPITAL_COMMUNITY): Payer: Medicare Other

## 2019-05-19 DIAGNOSIS — N183 Chronic kidney disease, stage 3 unspecified: Secondary | ICD-10-CM | POA: Diagnosis not present

## 2019-05-19 DIAGNOSIS — G9340 Encephalopathy, unspecified: Secondary | ICD-10-CM | POA: Diagnosis not present

## 2019-05-19 DIAGNOSIS — A419 Sepsis, unspecified organism: Secondary | ICD-10-CM | POA: Diagnosis not present

## 2019-05-19 DIAGNOSIS — J9621 Acute and chronic respiratory failure with hypoxia: Secondary | ICD-10-CM | POA: Diagnosis not present

## 2019-05-19 LAB — RENAL FUNCTION PANEL
Albumin: 1.8 g/dL — ABNORMAL LOW (ref 3.5–5.0)
Anion gap: 10 (ref 5–15)
BUN: 55 mg/dL — ABNORMAL HIGH (ref 8–23)
CO2: 23 mmol/L (ref 22–32)
Calcium: 8.3 mg/dL — ABNORMAL LOW (ref 8.9–10.3)
Chloride: 111 mmol/L (ref 98–111)
Creatinine, Ser: 1.55 mg/dL — ABNORMAL HIGH (ref 0.61–1.24)
GFR calc Af Amer: 54 mL/min — ABNORMAL LOW (ref >60)
GFR calc non Af Amer: 46 mL/min — ABNORMAL LOW (ref >60)
Glucose, Bld: 214 mg/dL — ABNORMAL HIGH (ref 70–99)
Phosphorus: 3.2 mg/dL (ref 2.5–4.6)
Potassium: 4.1 mmol/L (ref 3.5–5.1)
Sodium: 144 mmol/L (ref 135–145)

## 2019-05-19 LAB — CBC
HCT: 28.4 % — ABNORMAL LOW (ref 39.0–52.0)
Hemoglobin: 9.1 g/dL — ABNORMAL LOW (ref 13.0–17.0)
MCH: 29.8 pg (ref 26.0–34.0)
MCHC: 32 g/dL (ref 30.0–36.0)
MCV: 93.1 fL (ref 80.0–100.0)
Platelets: 165 10*3/uL (ref 150–400)
RBC: 3.05 MIL/uL — ABNORMAL LOW (ref 4.22–5.81)
RDW: 16 % — ABNORMAL HIGH (ref 11.5–15.5)
WBC: 9.9 10*3/uL (ref 4.0–10.5)
nRBC: 0 % (ref 0.0–0.2)

## 2019-05-19 LAB — MAGNESIUM: Magnesium: 2.4 mg/dL (ref 1.7–2.4)

## 2019-05-19 NOTE — Progress Notes (Signed)
Pulmonary Critical Care Medicine [facility] [place]   PULMONARY CRITICAL CARE SERVICE  PROGRESS NOTE  Date of Service: 05/19/2019  RIGGINS CISEK  EHU:[id number]  DOB: 1955/07/02   DOA: 05/24/2019  Referring Physician: Carron Curie, MD  HPI: James Merritt is a 63 y.o. male seen for follow up of Acute on Chronic Respiratory Failure.  Patient was attempted on the spontaneous breathing trial however did not tolerate it right now is back on assist control mode  Medications: Reviewed on Rounds  Physical Exam:  Vitals: Temperature 97.6 pulse 101 respiratory 16 blood pressure 122/92 saturations 98%  Ventilator Settings mode ventilation assist control FiO2 is 28% PEEP 5 tidal volume 500  . General: Comfortable at this time . Eyes: Grossly normal lids, irises & conjunctiva . ENT: grossly tongue is normal . Neck: no obvious mass . Cardiovascular: S1 S2 normal no gallop . Respiratory: No rhonchi no rales are noted at this time . Abdomen: soft . Skin: no rash seen on limited exam . Musculoskeletal: not rigid . Psychiatric:unable to assess . Neurologic: no seizure no involuntary movements         Lab Data:   Basic Metabolic Panel: Recent Labs  Lab 05/15/19 0031 05/16/19 0531 05/17/19 1625 05/19/19 0501  NA 141 140 140 144  K 3.7 3.6 4.4 4.1  CL 110 109 111 111  CO2 22 21* 20* 23  GLUCOSE 65* 195* 298* 214*  BUN 61* 60* 59* 55*  CREATININE 1.89* 1.73* 1.52* 1.55*  CALCIUM 7.6* 7.7* 7.7* 8.3*  MG  --   --  2.4 2.4  PHOS  --   --   --  3.2    ABG: Recent Labs  Lab 05/17/2019 2235  PHART 7.341*  PCO2ART 41.8  PO2ART 109*  HCO3 22.0  O2SAT 98.0    Liver Function Tests: Recent Labs  Lab 05/15/19 0031 05/19/19 0501  AST 27  --   ALT 24  --   ALKPHOS 72  --   BILITOT 0.4  --   PROT 5.4*  --   ALBUMIN 1.8* 1.8*   No results for input(s): LIPASE, AMYLASE in the last 168 hours. No results for input(s): AMMONIA in the last 168  hours.  CBC: Recent Labs  Lab 05/15/19 0031 05/16/19 0531 05/17/19 1625 05/19/19 0501  WBC 12.3* 8.0 9.6 9.9  HGB 8.9* 9.3* 9.4* 9.1*  HCT 28.2* 29.4* 29.4* 28.4*  MCV 95.3 95.5 93.9 93.1  PLT 231 166 143* 165    Cardiac Enzymes: No results for input(s): CKTOTAL, CKMB, CKMBINDEX, TROPONINI in the last 168 hours.  BNP (last 3 results) No results for input(s): BNP in the last 8760 hours.  ProBNP (last 3 results) No results for input(s): PROBNP in the last 8760 hours.  Radiological Exams: DG CHEST PORT 1 VIEW  Result Date: 05/19/2019 CLINICAL DATA:  Ventilator dependent. EXAM: PORTABLE CHEST 1 VIEW COMPARISON:  Two days ago FINDINGS: Endotracheal tube tip is between the clavicular heads and carina. The feeding tube at least reaches the diaphragm. Right PICC with tip at the upper cavoatrial junction. Hazy bilateral chest opacification. Stable interstitial coarsening. Normal heart size. No pneumothorax. Remote and healed right mid clavicle fracture. IMPRESSION: 1. Stable and unremarkable hardware positioning. 2. Stable layering pleural effusions and pulmonary opacity. Electronically Signed   By: Marnee Spring M.D.   On: 05/19/2019 07:03   DG CHEST PORT 1 VIEW  Result Date: 05/17/2019 CLINICAL DATA:  Acute respiratory failure. Endotracheally intubated. EXAM: PORTABLE CHEST  1 VIEW COMPARISON:  05/15/2019 FINDINGS: Endotracheal tube and feeding tube remain in place. A new right arm PICC line is seen with tip overlying the distal SVC. Heart size remains stable and within normal limits. Bilateral diffuse interstitial and airspace disease shows no significant change. Small to moderate layering right pleural effusion and bibasilar atelectasis are also stable. Decreased atelectasis or infiltrate seen in the left upper lobe since prior exam. IMPRESSION: New right arm PICC line in appropriate position. Decreased atelectasis or infiltrate in left upper lobe. No significant change in bilateral  diffuse interstitial and airspace disease, bibasilar atelectasis, and right pleural effusion. Electronically Signed   By: Marlaine Hind M.D.   On: 05/17/2019 16:56    Assessment/Plan Active Problems:   Acute on chronic respiratory failure with hypoxia (HCC)   Severe sepsis (HCC)   Chronic kidney disease, stage III (moderate)   Acute encephalopathy   1. Acute on chronic respiratory failure hypoxia plan is to continue with assessing the spontaneous breathing trial patient has not tolerated we will reassess again tomorrow 2. Severe sepsis hemodynamics are stable 3. Chronic kidney disease stage III we will continue with supportive care monitor labs 4. Encephalopathy no change   I have personally seen and evaluated the patient, evaluated laboratory and imaging results, formulated the assessment and plan and placed orders. The Patient requires high complexity decision making for assessment and support.  Case was discussed on Rounds with the Respiratory Therapy Staff  Allyne Gee, MD [facility] Pulmonary Critical Care Medicine Sleep Medicine

## 2019-05-20 DIAGNOSIS — N183 Chronic kidney disease, stage 3 unspecified: Secondary | ICD-10-CM | POA: Diagnosis not present

## 2019-05-20 DIAGNOSIS — G9340 Encephalopathy, unspecified: Secondary | ICD-10-CM | POA: Diagnosis not present

## 2019-05-20 DIAGNOSIS — A419 Sepsis, unspecified organism: Secondary | ICD-10-CM | POA: Diagnosis not present

## 2019-05-20 DIAGNOSIS — J9621 Acute and chronic respiratory failure with hypoxia: Secondary | ICD-10-CM | POA: Diagnosis not present

## 2019-05-20 LAB — RENAL FUNCTION PANEL
Albumin: 1.7 g/dL — ABNORMAL LOW (ref 3.5–5.0)
Anion gap: 7 (ref 5–15)
BUN: 57 mg/dL — ABNORMAL HIGH (ref 8–23)
CO2: 25 mmol/L (ref 22–32)
Calcium: 8.2 mg/dL — ABNORMAL LOW (ref 8.9–10.3)
Chloride: 115 mmol/L — ABNORMAL HIGH (ref 98–111)
Creatinine, Ser: 1.37 mg/dL — ABNORMAL HIGH (ref 0.61–1.24)
GFR calc Af Amer: >60 mL/min (ref >60)
GFR calc non Af Amer: 54 mL/min — ABNORMAL LOW (ref >60)
Glucose, Bld: 200 mg/dL — ABNORMAL HIGH (ref 70–99)
Phosphorus: 3 mg/dL (ref 2.5–4.6)
Potassium: 4.1 mmol/L (ref 3.5–5.1)
Sodium: 147 mmol/L — ABNORMAL HIGH (ref 135–145)

## 2019-05-20 LAB — MAGNESIUM: Magnesium: 2.2 mg/dL (ref 1.7–2.4)

## 2019-05-20 NOTE — Progress Notes (Signed)
Pulmonary Critical Care Medicine [place]   PULMONARY CRITICAL CARE SERVICE  PROGRESS NOTE  Date of Service: 05/20/2019  James Merritt  UVO:[id number]  DOB: [birth year]   DOA: 05/07/2019  Referring Physician: Merton Border, MD  HPI: James Merritt is a 63 y.o. male seen for follow up of Acute on Chronic Respiratory Failure.  Patient currently is on full support and assist control mode has been on 28% FiO2 remains orally intubated  Medications: Reviewed on Rounds  Physical Exam:  Vitals: Temperature 99.2 pulse 95 respiratory rate 30 blood pressure 150/77 saturations 100%  Ventilator Settings mode of ventilation assist control FiO2 is 28% tidal volume 500 PEEP is 5  . General: Comfortable at this time . Eyes: Grossly normal lids, irises & conjunctiva . ENT: grossly tongue is normal . Neck: no obvious mass . Cardiovascular: S1 S2 normal no gallop . Respiratory: No rhonchi no rales are noted at this time . Abdomen: soft . Skin: no rash seen on limited exam . Musculoskeletal: not rigid . Psychiatric:unable to assess . Neurologic: no seizure no involuntary movements         Lab Data:   Basic Metabolic Panel: Recent Labs  Lab 05/15/19 0031 05/16/19 0531 05/17/19 1625 05/19/19 0501 05/20/19 0800  NA 141 140 140 144 147*  K 3.7 3.6 4.4 4.1 4.1  CL 110 109 111 111 115*  CO2 22 21* 20* 23 25  GLUCOSE 65* 195* 298* 214* 200*  BUN 61* 60* 59* 55* 57*  CREATININE 1.89* 1.73* 1.52* 1.55* 1.37*  CALCIUM 7.6* 7.7* 7.7* 8.3* 8.2*  MG  --   --  2.4 2.4 2.2  PHOS  --   --   --  3.2 3.0    ABG: Recent Labs  Lab 05/13/2019 2235  PHART 7.341*  PCO2ART 41.8  PO2ART 109*  HCO3 22.0  O2SAT 98.0    Liver Function Tests: Recent Labs  Lab 05/15/19 0031 05/19/19 0501 05/20/19 0800  AST 27  --   --   ALT 24  --   --   ALKPHOS 72  --   --   BILITOT 0.4  --   --   PROT 5.4*  --   --   ALBUMIN 1.8* 1.8* 1.7*   No results for input(s): LIPASE,  AMYLASE in the last 168 hours. No results for input(s): AMMONIA in the last 168 hours.  CBC: Recent Labs  Lab 05/15/19 0031 05/16/19 0531 05/17/19 1625 05/19/19 0501  WBC 12.3* 8.0 9.6 9.9  HGB 8.9* 9.3* 9.4* 9.1*  HCT 28.2* 29.4* 29.4* 28.4*  MCV 95.3 95.5 93.9 93.1  PLT 231 166 143* 165    Cardiac Enzymes: No results for input(s): CKTOTAL, CKMB, CKMBINDEX, TROPONINI in the last 168 hours.  BNP (last 3 results) No results for input(s): BNP in the last 8760 hours.  ProBNP (last 3 results) No results for input(s): PROBNP in the last 8760 hours.  Radiological Exams: DG CHEST PORT 1 VIEW  Result Date: 05/19/2019 CLINICAL DATA:  Ventilator dependent. EXAM: PORTABLE CHEST 1 VIEW COMPARISON:  Two days ago FINDINGS: Endotracheal tube tip is between the clavicular heads and carina. The feeding tube at least reaches the diaphragm. Right PICC with tip at the upper cavoatrial junction. Hazy bilateral chest opacification. Stable interstitial coarsening. Normal heart size. No pneumothorax. Remote and healed right mid clavicle fracture. IMPRESSION: 1. Stable and unremarkable hardware positioning. 2. Stable layering pleural effusions and pulmonary opacity. Electronically Signed   By: Angelica Chessman  Watts M.D.   On: 05/19/2019 07:03    Assessment/Plan Active Problems:   Acute on chronic respiratory failure with hypoxia (HCC)   Severe sepsis (HCC)   Chronic kidney disease, stage III (moderate)   Acute encephalopathy   1. Acute on chronic respiratory failure hypoxia plan is to continue with full support on assist control mode patient's been on 28% FiO2 with a PEEP of 5 and tidal volume 500 this is the baseline 2. Severe sepsis hemodynamics are stable we will continue with supportive care monitor pressures 3. Chronic kidney disease stage III following labs 4. Encephalopathy no change we will continue with supportive care   I have personally seen and evaluated the patient, evaluated laboratory  and imaging results, formulated the assessment and plan and placed orders. The Patient requires high complexity decision making for assessment and support.  Case was discussed on Rounds with the Respiratory Therapy Staff  Yevonne Pax, MD [facility] Pulmonary Critical Care Medicine Sleep Medicine

## 2019-05-21 ENCOUNTER — Encounter (HOSPITAL_BASED_OUTPATIENT_CLINIC_OR_DEPARTMENT_OTHER): Payer: Medicare Other

## 2019-05-21 DIAGNOSIS — A419 Sepsis, unspecified organism: Secondary | ICD-10-CM | POA: Diagnosis not present

## 2019-05-21 DIAGNOSIS — J9621 Acute and chronic respiratory failure with hypoxia: Secondary | ICD-10-CM | POA: Diagnosis not present

## 2019-05-21 DIAGNOSIS — N183 Chronic kidney disease, stage 3 unspecified: Secondary | ICD-10-CM | POA: Diagnosis not present

## 2019-05-21 DIAGNOSIS — M7989 Other specified soft tissue disorders: Secondary | ICD-10-CM

## 2019-05-21 DIAGNOSIS — G9340 Encephalopathy, unspecified: Secondary | ICD-10-CM | POA: Diagnosis not present

## 2019-05-21 LAB — RENAL FUNCTION PANEL
Albumin: 1.7 g/dL — ABNORMAL LOW (ref 3.5–5.0)
Anion gap: 8 (ref 5–15)
BUN: 58 mg/dL — ABNORMAL HIGH (ref 8–23)
CO2: 24 mmol/L (ref 22–32)
Calcium: 8 mg/dL — ABNORMAL LOW (ref 8.9–10.3)
Chloride: 110 mmol/L (ref 98–111)
Creatinine, Ser: 1.27 mg/dL — ABNORMAL HIGH (ref 0.61–1.24)
GFR calc Af Amer: >60 mL/min (ref >60)
GFR calc non Af Amer: 59 mL/min — ABNORMAL LOW (ref >60)
Glucose, Bld: 231 mg/dL — ABNORMAL HIGH (ref 70–99)
Phosphorus: 3.1 mg/dL (ref 2.5–4.6)
Potassium: 3.9 mmol/L (ref 3.5–5.1)
Sodium: 142 mmol/L (ref 135–145)

## 2019-05-21 LAB — ABO/RH: ABO/RH(D): O POS

## 2019-05-21 LAB — CBC
HCT: 21.2 % — ABNORMAL LOW (ref 39.0–52.0)
HCT: 22.1 % — ABNORMAL LOW (ref 39.0–52.0)
Hemoglobin: 6.6 g/dL — CL (ref 13.0–17.0)
Hemoglobin: 6.7 g/dL — CL (ref 13.0–17.0)
MCH: 30.4 pg (ref 26.0–34.0)
MCH: 29.6 pg (ref 26.0–34.0)
MCHC: 31.1 g/dL (ref 30.0–36.0)
MCHC: 30.3 g/dL (ref 30.0–36.0)
MCV: 97.7 fL (ref 80.0–100.0)
MCV: 97.8 fL (ref 80.0–100.0)
Platelets: 120 10*3/uL — ABNORMAL LOW (ref 150–400)
Platelets: 111 10*3/uL — ABNORMAL LOW (ref 150–400)
RBC: 2.17 MIL/uL — ABNORMAL LOW (ref 4.22–5.81)
RBC: 2.26 MIL/uL — ABNORMAL LOW (ref 4.22–5.81)
RDW: 16.2 % — ABNORMAL HIGH (ref 11.5–15.5)
RDW: 16.3 % — ABNORMAL HIGH (ref 11.5–15.5)
WBC: 6.6 10*3/uL (ref 4.0–10.5)
WBC: 7.7 10*3/uL (ref 4.0–10.5)
nRBC: 0 % (ref 0.0–0.2)
nRBC: 0 % (ref 0.0–0.2)

## 2019-05-21 LAB — LEVETIRACETAM LEVEL: Levetiracetam Lvl: 23.2 ug/mL (ref 10.0–40.0)

## 2019-05-21 LAB — PREPARE RBC (CROSSMATCH)

## 2019-05-21 MED ORDER — GENERIC EXTERNAL MEDICATION
Status: DC
Start: ? — End: 2019-05-21

## 2019-05-21 NOTE — Progress Notes (Signed)
Right upper ext venous duplex  has been completed. Refer to [facility] under chart review to view preliminary results.   05/21/2019  10:43 AM Phyllis Abelson, Bonnye Fava

## 2019-05-21 NOTE — Progress Notes (Signed)
[facility] [place]   PULMONARY CRITICAL CARE SERVICE  PROGRESS NOTE  Date of Service: 05/21/2019  James Merritt  ZOX:[id number]  DOB: 08-24-1955   DOA: 26-Oct-2018  Referring Physician: Carron CurieAli Hijazi, MD  HPI: James Merritt is a 63 y.o. male seen for follow up of Acute on Chronic Respiratory Failure.  Patient is currently on full support on assist control mode has been on 28% FiO2 with a PEEP of 5 he remains orally intubated were going to try once again spontaneous breathing trial  Medications: Reviewed on Rounds  Physical Exam:  Vitals: Temperature is 98.0 pulse 74 respiratory 26 blood pressure 112/61 saturations 100%  Ventilator Settings mode of ventilation assist control FiO2 28% tidal volume 500 PEEP 5  . General: Comfortable at this time . Eyes: Grossly normal lids, irises & conjunctiva . ENT: grossly tongue is normal . Neck: no obvious mass . Cardiovascular: S1 S2 normal no gallop . Respiratory: No rhonchi coarse breath sounds are noted at this time . Abdomen: soft . Skin: no rash seen on limited exam . Musculoskeletal: not rigid . Psychiatric:unable to assess . Neurologic: no seizure no involuntary movements         Lab Data:   Basic Metabolic Panel: Recent Labs  Lab 05/16/19 0531 05/17/19 1625 05/19/19 0501 05/20/19 0800 05/21/19 0500  NA 140 140 144 147* 142  K 3.6 4.4 4.1 4.1 3.9  CL 109 111 111 115* 110  CO2 21* 20* 23 25 24   GLUCOSE 195* 298* 214* 200* 231*  BUN 60* 59* 55* 57* 58*  CREATININE 1.73* 1.52* 1.55* 1.37* 1.27*  CALCIUM 7.7* 7.7* 8.3* 8.2* 8.0*  MG  --  2.4 2.4 2.2  --   PHOS  --   --  3.2 3.0 3.1    ABG: Recent Labs  Lab 2018/07/25 2235  PHART 7.341*  PCO2ART 41.8  PO2ART 109*  HCO3 22.0  O2SAT 98.0    Liver Function Tests: Recent Labs  Lab 05/15/19 0031 05/19/19 0501 05/20/19 0800 05/21/19 0500  AST 27  --   --   --   ALT 24  --   --   --   ALKPHOS 72  --   --   --    BILITOT 0.4  --   --   --   PROT 5.4*  --   --   --   ALBUMIN 1.8* 1.8* 1.7* 1.7*   No results for input(s): LIPASE, AMYLASE in the last 168 hours. No results for input(s): AMMONIA in the last 168 hours.  CBC: Recent Labs  Lab 05/16/19 0531 05/17/19 1625 05/19/19 0501 05/21/19 0500 05/21/19 0819  WBC 8.0 9.6 9.9 6.6 7.7  HGB 9.3* 9.4* 9.1* 6.6* 6.7*  HCT 29.4* 29.4* 28.4* 21.2* 22.1*  MCV 95.5 93.9 93.1 97.7 97.8  PLT 166 143* 165 120* 111*    Cardiac Enzymes: No results for input(s): CKTOTAL, CKMB, CKMBINDEX, TROPONINI in the last 168 hours.  BNP (last 3 results) No results for input(s): BNP in the last 8760 hours.  ProBNP (last 3 results) No results for input(s): PROBNP in the last 8760 hours.  Radiological Exams: VAS US UPPER EXTREMITY VENOUS DUPLEX  Result Date: 05/21/2019 UPPER VENOUS STUDY  Indications: Swelling, and Edema Comparison Study: No priors. Performing Technologist: Marilynne Halstedita Sturdivant RDMS, RVT  Examination Guidelines: A complete evaluation includes B-mode imaging, spectral Doppler, color Doppler, and power Doppler as needed of all accessible portions of each vessel. Bilateral testing  is considered an integral part of a complete examination. Limited examinations for reoccurring indications may be performed as noted.  Right Findings: +----------+------------+---------+-----------+----------+-------+ RIGHT     CompressiblePhasicitySpontaneousPropertiesSummary +----------+------------+---------+-----------+----------+-------+ IJV           Full                                          +----------+------------+---------+-----------+----------+-------+ Subclavian    Full                                          +----------+------------+---------+-----------+----------+-------+ Axillary      Full                                          +----------+------------+---------+-----------+----------+-------+ Brachial      Full                                           +----------+------------+---------+-----------+----------+-------+ Radial        Full                                          +----------+------------+---------+-----------+----------+-------+ Ulnar         Full                                          +----------+------------+---------+-----------+----------+-------+ Cephalic      Full                                          +----------+------------+---------+-----------+----------+-------+ Basilic       Full                                          +----------+------------+---------+-----------+----------+-------+  Left Findings: +----------+------------+---------+-----------+----------+-------+ LEFT      CompressiblePhasicitySpontaneousPropertiesSummary +----------+------------+---------+-----------+----------+-------+ Subclavian    Full       Yes       Yes                      +----------+------------+---------+-----------+----------+-------+  Summary:  Right: No evidence of deep vein thrombosis in the upper extremity. No evidence of superficial vein thrombosis in the upper extremity.  Left: No evidence of thrombosis in the subclavian.  *See table(s) above for measurements and observations.    Preliminary     Assessment/Plan Active Problems:   Acute on chronic respiratory failure with hypoxia (HCC)   Severe sepsis (HCC)   Chronic kidney disease, stage III (moderate)   Acute encephalopathy   1. Acute on chronic respiratory failure hypoxia the plan is to continue attempting weaning will check the mechanics patient did undergo a ultrasound negative for DVT 2. Severe sepsis hemodynamics are stable we will  continue with supportive care 3. Chronic kidney disease stage III monitor labs 4. Acute encephalopathy no change.   I have personally seen and evaluated the patient, evaluated laboratory and imaging results, formulated the assessment and plan and placed orders.  Time 35 minutes  patient is orally intubated high risk airway dislodgment The Patient requires high complexity decision making for assessment and support.  Case was discussed on Rounds with the Respiratory Therapy Staff  Allyne Gee, MD [facility] Pulmonary Critical Care Medicine Sleep Medicine

## 2019-05-22 DIAGNOSIS — A419 Sepsis, unspecified organism: Secondary | ICD-10-CM | POA: Diagnosis not present

## 2019-05-22 DIAGNOSIS — J9621 Acute and chronic respiratory failure with hypoxia: Secondary | ICD-10-CM | POA: Diagnosis not present

## 2019-05-22 DIAGNOSIS — G9340 Encephalopathy, unspecified: Secondary | ICD-10-CM | POA: Diagnosis not present

## 2019-05-22 DIAGNOSIS — Z9911 Dependence on respirator [ventilator] status: Secondary | ICD-10-CM

## 2019-05-22 DIAGNOSIS — N183 Chronic kidney disease, stage 3 unspecified: Secondary | ICD-10-CM | POA: Diagnosis not present

## 2019-05-22 LAB — CBC
HCT: 23.2 % — ABNORMAL LOW (ref 39.0–52.0)
Hemoglobin: 7.4 g/dL — ABNORMAL LOW (ref 13.0–17.0)
MCH: 30.2 pg (ref 26.0–34.0)
MCHC: 31.9 g/dL (ref 30.0–36.0)
MCV: 94.7 fL (ref 80.0–100.0)
Platelets: 120 10*3/uL — ABNORMAL LOW (ref 150–400)
RBC: 2.45 MIL/uL — ABNORMAL LOW (ref 4.22–5.81)
RDW: 16.1 % — ABNORMAL HIGH (ref 11.5–15.5)
WBC: 5.9 10*3/uL (ref 4.0–10.5)
nRBC: 0 % (ref 0.0–0.2)

## 2019-05-22 LAB — BASIC METABOLIC PANEL
Anion gap: 4 — ABNORMAL LOW (ref 5–15)
BUN: 65 mg/dL — ABNORMAL HIGH (ref 8–23)
CO2: 25 mmol/L (ref 22–32)
Calcium: 8 mg/dL — ABNORMAL LOW (ref 8.9–10.3)
Chloride: 113 mmol/L — ABNORMAL HIGH (ref 98–111)
Creatinine, Ser: 1.29 mg/dL — ABNORMAL HIGH (ref 0.61–1.24)
GFR calc Af Amer: >60 mL/min (ref >60)
GFR calc non Af Amer: 58 mL/min — ABNORMAL LOW (ref >60)
Glucose, Bld: 163 mg/dL — ABNORMAL HIGH (ref 70–99)
Potassium: 4.2 mmol/L (ref 3.5–5.1)
Sodium: 142 mmol/L (ref 135–145)

## 2019-05-22 LAB — PHOSPHORUS: Phosphorus: 3.7 mg/dL (ref 2.5–4.6)

## 2019-05-22 LAB — OCCULT BLOOD X 1 CARD TO LAB, STOOL: Fecal Occult Bld: POSITIVE — AB

## 2019-05-22 LAB — MAGNESIUM: Magnesium: 2.4 mg/dL (ref 1.7–2.4)

## 2019-05-22 NOTE — Progress Notes (Addendum)
[facility] [place]   PULMONARY CRITICAL CARE SERVICE  PROGRESS NOTE  Date of Service: 05/22/2019  James Merritt  WUJ:[id number]  DOB: 24-Dec-1955   DOA: 05-27-19  Referring Physician: Carron CurieAli Hijazi, MD  HPI: James Merritt is a 63 y.o. male seen for follow up of Acute on Chronic Respiratory Failure.  Patient is on ventilator remains orally intubated with endotracheal tube in place and time saturations are 95%.  We are trying to wean patient once again on pressure support currently is good volumes are noted  Medications: Reviewed on Rounds  Physical Exam:  Vitals: Temperature 96.1 pulse 64 respiratory rate is 18 blood pressure 104/61 saturations 100%  Ventilator Settings mode of ventilation is pressure support FiO2 28% tidal volume 600 pressure poor 12 PEEP 5  . General: Comfortable at this time . Eyes: Grossly normal lids, irises & conjunctiva . ENT: grossly tongue is normal . Neck: no obvious mass . Cardiovascular: S1 S2 normal no gallop . Respiratory: No rhonchi coarse breath sounds. . Abdomen: soft . Skin: no rash seen on limited exam . Musculoskeletal: not rigid . Psychiatric:unable to assess . Neurologic: no seizure no involuntary movements         Lab Data:   Basic Metabolic Panel: Recent Labs  Lab 05/17/19 1625 05/19/19 0501 05/20/19 0800 05/21/19 0500 05/22/19 0500  NA 140 144 147* 142 142  K 4.4 4.1 4.1 3.9 4.2  CL 111 111 115* 110 113*  CO2 20* 23 25 24 25   GLUCOSE 298* 214* 200* 231* 163*  BUN 59* 55* 57* 58* 65*  CREATININE 1.52* 1.55* 1.37* 1.27* 1.29*  CALCIUM 7.7* 8.3* 8.2* 8.0* 8.0*  MG 2.4 2.4 2.2  --  2.4  PHOS  --  3.2 3.0 3.1 3.7    ABG: No results for input(s): PHART, PCO2ART, PO2ART, HCO3, O2SAT in the last 168 hours.  Liver Function Tests: Recent Labs  Lab 05/19/19 0501 05/20/19 0800 05/21/19 0500  ALBUMIN 1.8* 1.7* 1.7*   No results for input(s): LIPASE, AMYLASE in the last 168  hours. No results for input(s): AMMONIA in the last 168 hours.  CBC: Recent Labs  Lab 05/17/19 1625 05/19/19 0501 05/21/19 0500 05/21/19 0819 05/22/19 0500  WBC 9.6 9.9 6.6 7.7 5.9  HGB 9.4* 9.1* 6.6* 6.7* 7.4*  HCT 29.4* 28.4* 21.2* 22.1* 23.2*  MCV 93.9 93.1 97.7 97.8 94.7  PLT 143* 165 120* 111* 120*    Cardiac Enzymes: No results for input(s): CKTOTAL, CKMB, CKMBINDEX, TROPONINI in the last 168 hours.  BNP (last 3 results) No results for input(s): BNP in the last 8760 hours.  ProBNP (last 3 results) No results for input(s): PROBNP in the last 8760 hours.  Radiological Exams: VAS US UPPER EXTREMITY VENOUS DUPLEX  Result Date: 05/21/2019 UPPER VENOUS STUDY  Indications: Swelling, and Edema Comparison Study: No priors. Performing Technologist: Marilynne Halstedita Sturdivant RDMS, RVT  Examination Guidelines: A complete evaluation includes B-mode imaging, spectral Doppler, color Doppler, and power Doppler as needed of all accessible portions of each vessel. Bilateral testing is considered an integral part of a complete examination. Limited examinations for reoccurring indications may be performed as noted.  Right Findings: +----------+------------+---------+-----------+----------+-------+ RIGHT     CompressiblePhasicitySpontaneousPropertiesSummary +----------+------------+---------+-----------+----------+-------+ IJV           Full                                          +----------+------------+---------+-----------+----------+-------+  Subclavian    Full                                          +----------+------------+---------+-----------+----------+-------+ Axillary      Full                                          +----------+------------+---------+-----------+----------+-------+ Brachial      Full                                          +----------+------------+---------+-----------+----------+-------+ Radial        Full                                           +----------+------------+---------+-----------+----------+-------+ Ulnar         Full                                          +----------+------------+---------+-----------+----------+-------+ Cephalic      Full                                          +----------+------------+---------+-----------+----------+-------+ Basilic       Full                                          +----------+------------+---------+-----------+----------+-------+  Left Findings: +----------+------------+---------+-----------+----------+-------+ LEFT      CompressiblePhasicitySpontaneousPropertiesSummary +----------+------------+---------+-----------+----------+-------+ Subclavian    Full       Yes       Yes                      +----------+------------+---------+-----------+----------+-------+  Summary:  Right: No evidence of deep vein thrombosis in the upper extremity. No evidence of superficial vein thrombosis in the upper extremity.  Left: No evidence of thrombosis in the subclavian.  *See table(s) above for measurements and observations.  Diagnosing physician: Monica Martinez MD Electronically signed by Monica Martinez MD on 05/21/2019 at 5:08:04 PM.    Final     Assessment/Plan Active Problems:   Acute on chronic respiratory failure with hypoxia (HCC)   Severe sepsis ([place])   Chronic kidney disease, stage III (moderate)   Acute encephalopathy   1. Acute on chronic respiratory failure with hypoxia the plan is going to be to continue to advance the pressure support as already mentioned above patient remains orally intubated has a high risk airway so is requiring ongoing close monitoring.  If patient is able to tolerate today's weaning goal and looking towards possible extubation patient in the next few days 2. Severe sepsis hemodynamics are stable right now off of pressors we will continue to monitor closely. 3. Chronic kidney disease stage III at baseline we will  continue to follow 4. Acute encephalopathy no changes noted at this time  I have personally seen and evaluated the patient, evaluated laboratory and imaging results, formulated the assessment and plan and placed orders.  Time 35 minutes extended discussion during rounds The Patient requires high complexity decision making for assessment and support.  Case was discussed on Rounds with the Respiratory Therapy Staff  Yevonne Pax, MD [facility] Pulmonary Critical Care Medicine Sleep Medicine

## 2019-05-23 DIAGNOSIS — J9621 Acute and chronic respiratory failure with hypoxia: Secondary | ICD-10-CM | POA: Diagnosis not present

## 2019-05-23 DIAGNOSIS — A419 Sepsis, unspecified organism: Secondary | ICD-10-CM | POA: Diagnosis not present

## 2019-05-23 DIAGNOSIS — G9340 Encephalopathy, unspecified: Secondary | ICD-10-CM | POA: Diagnosis not present

## 2019-05-23 DIAGNOSIS — N183 Chronic kidney disease, stage 3 unspecified: Secondary | ICD-10-CM | POA: Diagnosis not present

## 2019-05-23 LAB — CBC
HCT: 22.1 % — ABNORMAL LOW (ref 39.0–52.0)
HCT: 23.9 % — ABNORMAL LOW (ref 39.0–52.0)
Hemoglobin: 6.9 g/dL — CL (ref 13.0–17.0)
Hemoglobin: 7.6 g/dL — ABNORMAL LOW (ref 13.0–17.0)
MCH: 30.4 pg (ref 26.0–34.0)
MCH: 30.8 pg (ref 26.0–34.0)
MCHC: 31.2 g/dL (ref 30.0–36.0)
MCHC: 31.8 g/dL (ref 30.0–36.0)
MCV: 97.4 fL (ref 80.0–100.0)
MCV: 96.8 fL (ref 80.0–100.0)
Platelets: 95 K/uL — ABNORMAL LOW (ref 150–400)
Platelets: 92 10*3/uL — ABNORMAL LOW (ref 150–400)
RBC: 2.27 MIL/uL — ABNORMAL LOW (ref 4.22–5.81)
RBC: 2.47 MIL/uL — ABNORMAL LOW (ref 4.22–5.81)
RDW: 15.9 % — ABNORMAL HIGH (ref 11.5–15.5)
RDW: 15.9 % — ABNORMAL HIGH (ref 11.5–15.5)
WBC: 6.5 K/uL (ref 4.0–10.5)
WBC: 6.5 10*3/uL (ref 4.0–10.5)
nRBC: 0 % (ref 0.0–0.2)
nRBC: 0 % (ref 0.0–0.2)

## 2019-05-23 LAB — PREPARE RBC (CROSSMATCH)

## 2019-05-23 NOTE — Progress Notes (Signed)
Pulmonary Critical Care Medicine [facility] [place]   PULMONARY CRITICAL CARE SERVICE  PROGRESS NOTE  Date of Service: 05/23/2019  James Merritt  [id number]:[id number]  DOB: November 06, 1955   DOA: 05/01/2019  Referring Physician: Carron Curie, MD  HPI: James Merritt is a 63 y.o. male seen for follow up of Acute on Chronic Respiratory Failure.  Patient currently is on pressure support mode the goal today is to wean for about 16 hours patient appears to be doing well so far remains orally intubated my plan would be to try to wean and eventually extubate the patient if patient is able to tolerate the pressure support for 24 hours continuously.  The only concern is heart rate issues which have been on and off but are being followed by cardiology.  Medications: Reviewed on Rounds  Physical Exam:  Vitals: Temperature 96.0 pulse 67 respiratory rate 17 blood pressure 147/51 saturations 98%  Ventilator Settings mode of ventilation pressure support FiO2 is 28% pressure support 12 PEEP 5  . General: Comfortable at this time . Eyes: Grossly normal lids, irises & conjunctiva . ENT: grossly tongue is normal . Neck: no obvious mass . Cardiovascular: S1 S2 normal no gallop . Respiratory: No rhonchi no rales are noted at this time . Abdomen: soft . Skin: no rash seen on limited exam . Musculoskeletal: not rigid . Psychiatric:unable to assess . Neurologic: no seizure no involuntary movements         Lab Data:   Basic Metabolic Panel: Recent Labs  Lab 05/17/19 1625 05/19/19 0501 05/20/19 0800 05/21/19 0500 05/22/19 0500  NA 140 144 147* 142 142  K 4.4 4.1 4.1 3.9 4.2  CL 111 111 115* 110 113*  CO2 20* 23 25 24 25   GLUCOSE 298* 214* 200* 231* 163*  BUN 59* 55* 57* 58* 65*  CREATININE 1.52* 1.55* 1.37* 1.27* 1.29*  CALCIUM 7.7* 8.3* 8.2* 8.0* 8.0*  MG 2.4 2.4 2.2  --  2.4  PHOS  --  3.2 3.0 3.1 3.7    ABG: No results for input(s): PHART, PCO2ART, PO2ART, HCO3, O2SAT in the  last 168 hours.  Liver Function Tests: Recent Labs  Lab 05/19/19 0501 05/20/19 0800 05/21/19 0500  ALBUMIN 1.8* 1.7* 1.7*   No results for input(s): LIPASE, AMYLASE in the last 168 hours. No results for input(s): AMMONIA in the last 168 hours.  CBC: Recent Labs  Lab 05/19/19 0501 05/21/19 0500 05/21/19 0819 05/22/19 0500 05/23/19 0500  WBC 9.9 6.6 7.7 5.9 6.5  HGB 9.1* 6.6* 6.7* 7.4* 6.9*  HCT 28.4* 21.2* 22.1* 23.2* 22.1*  MCV 93.1 97.7 97.8 94.7 97.4  PLT 165 120* 111* 120* 95*    Cardiac Enzymes: No results for input(s): CKTOTAL, CKMB, CKMBINDEX, TROPONINI in the last 168 hours.  BNP (last 3 results) No results for input(s): BNP in the last 8760 hours.  ProBNP (last 3 results) No results for input(s): PROBNP in the last 8760 hours.  Radiological Exams: No results found.  Assessment/Plan Active Problems:   Acute on chronic respiratory failure with hypoxia (HCC)   Severe sepsis (HCC)   Chronic kidney disease, stage III (moderate)   Acute encephalopathy   1. Acute on chronic respiratory failure hypoxia plan is to continue with weaning goal 16 hours on pressure support patient remains orally intubated has high risk airway 2. Severe sepsis resolved hemodynamics are stable continue with present management 3. Chronic kidney disease stage III following labs 4. Acute encephalopathy no changes are noted at  this time 5. Bradycardia followed by cardiology consultation appreciated   I have personally seen and evaluated the patient, evaluated laboratory and imaging results, formulated the assessment and plan and placed orders.  Time 35 minutes patient critically ill has high risk oral intubation airway The Patient requires high complexity decision making for assessment and support.  Case was discussed on Rounds with the Respiratory Therapy Staff  Allyne Gee, MD [facility] Pulmonary Critical Care Medicine Sleep Medicine

## 2019-05-24 DIAGNOSIS — J9621 Acute and chronic respiratory failure with hypoxia: Secondary | ICD-10-CM | POA: Diagnosis not present

## 2019-05-24 DIAGNOSIS — G9340 Encephalopathy, unspecified: Secondary | ICD-10-CM | POA: Diagnosis not present

## 2019-05-24 DIAGNOSIS — A419 Sepsis, unspecified organism: Secondary | ICD-10-CM | POA: Diagnosis not present

## 2019-05-24 DIAGNOSIS — N183 Chronic kidney disease, stage 3 unspecified: Secondary | ICD-10-CM | POA: Diagnosis not present

## 2019-05-24 LAB — RENAL FUNCTION PANEL
Albumin: 1.7 g/dL — ABNORMAL LOW (ref 3.5–5.0)
Anion gap: 6 (ref 5–15)
BUN: 80 mg/dL — ABNORMAL HIGH (ref 8–23)
CO2: 25 mmol/L (ref 22–32)
Calcium: 8 mg/dL — ABNORMAL LOW (ref 8.9–10.3)
Chloride: 110 mmol/L (ref 98–111)
Creatinine, Ser: 1.63 mg/dL — ABNORMAL HIGH (ref 0.61–1.24)
GFR calc Af Amer: 50 mL/min — ABNORMAL LOW (ref >60)
GFR calc non Af Amer: 44 mL/min — ABNORMAL LOW (ref >60)
Glucose, Bld: 141 mg/dL — ABNORMAL HIGH (ref 70–99)
Phosphorus: 4.4 mg/dL (ref 2.5–4.6)
Potassium: 4.5 mmol/L (ref 3.5–5.1)
Sodium: 141 mmol/L (ref 135–145)

## 2019-05-24 LAB — CBC
HCT: 23.1 % — ABNORMAL LOW (ref 39.0–52.0)
Hemoglobin: 7.2 g/dL — ABNORMAL LOW (ref 13.0–17.0)
MCH: 30.4 pg (ref 26.0–34.0)
MCHC: 31.2 g/dL (ref 30.0–36.0)
MCV: 97.5 fL (ref 80.0–100.0)
Platelets: 82 10*3/uL — ABNORMAL LOW (ref 150–400)
RBC: 2.37 MIL/uL — ABNORMAL LOW (ref 4.22–5.81)
RDW: 15.9 % — ABNORMAL HIGH (ref 11.5–15.5)
WBC: 5 10*3/uL (ref 4.0–10.5)
nRBC: 0 % (ref 0.0–0.2)

## 2019-05-24 LAB — MAGNESIUM: Magnesium: 2.4 mg/dL (ref 1.7–2.4)

## 2019-05-24 NOTE — Progress Notes (Addendum)
Pulmonary Critical Care Medicine [facility] [place]   PULMONARY CRITICAL CARE SERVICE  PROGRESS NOTE  Date of Service: 05/24/2019  James Merritt  [id number]  DOB: [birth year]   DOA: 05/17/2019  Referring Physician: Carron Curie, MD  HPI: James Merritt is a 63 y.o. male seen for follow up of Acute on Chronic Respiratory Failure.  Patient right now is on pressure support mode has been on 28% FiO2 with good saturations has been on pressure support of 12/5  Medications: Reviewed on Rounds  Physical Exam:  Vitals: Temperature 98.6 pulse 77 respiratory 19 blood pressure 131/69 saturations 94%  Ventilator Settings mode of ventilation pressure support FiO2 28% tidal volume 460 pressure poor 12 PEEP 5 patient is orally intubated  . General: Comfortable at this time . Eyes: Grossly normal lids, irises & conjunctiva . ENT: grossly tongue is normal . Neck: no obvious mass . Cardiovascular: S1 S2 normal no gallop . Respiratory: Few scattered rhonchi are noted at this time . Abdomen: soft . Skin: no rash seen on limited exam . Musculoskeletal: not rigid . Psychiatric:unable to assess . Neurologic: no seizure no involuntary movements         Lab Data:   Basic Metabolic Panel: Recent Labs  Lab 05/17/19 1625 05/19/19 0501 05/20/19 0800 05/21/19 0500 05/22/19 0500 05/24/19 0500  NA 140 144 147* 142 142 141  K 4.4 4.1 4.1 3.9 4.2 4.5  CL 111 111 115* 110 113* 110  CO2 20* 23 25 24 25 25   GLUCOSE 298* 214* 200* 231* 163* 141*  BUN 59* 55* 57* 58* 65* 80*  CREATININE 1.52* 1.55* 1.37* 1.27* 1.29* 1.63*  CALCIUM 7.7* 8.3* 8.2* 8.0* 8.0* 8.0*  MG 2.4 2.4 2.2  --  2.4 2.4  PHOS  --  3.2 3.0 3.1 3.7 4.4    ABG: No results for input(s): PHART, PCO2ART, PO2ART, HCO3, O2SAT in the last 168 hours.  Liver Function Tests: Recent Labs  Lab 05/19/19 0501 05/20/19 0800 05/21/19 0500 05/24/19 0500  ALBUMIN 1.8* 1.7* 1.7* 1.7*   No results for input(s): LIPASE,  AMYLASE in the last 168 hours. No results for input(s): AMMONIA in the last 168 hours.  CBC: Recent Labs  Lab 05/21/19 0819 05/22/19 0500 05/23/19 0500 05/23/19 1544 05/24/19 0500  WBC 7.7 5.9 6.5 6.5 5.0  HGB 6.7* 7.4* 6.9* 7.6* 7.2*  HCT 22.1* 23.2* 22.1* 23.9* 23.1*  MCV 97.8 94.7 97.4 96.8 97.5  PLT 111* 120* 95* 92* 82*    Cardiac Enzymes: No results for input(s): CKTOTAL, CKMB, CKMBINDEX, TROPONINI in the last 168 hours.  BNP (last 3 results) No results for input(s): BNP in the last 8760 hours.  ProBNP (last 3 results) No results for input(s): PROBNP in the last 8760 hours.  Radiological Exams: No results found.  Assessment/Plan Active Problems:   Acute on chronic respiratory failure with hypoxia (HCC)   Severe sepsis (HCC)   Chronic kidney disease, stage III (moderate)   Acute encephalopathy   1. Acute on chronic respiratory failure with hypoxia patient continues on pressure support mode and going to extended to 24 hours the main issue however right now is that hemoglobin which continues to show a downward trend this morning the patient's hemoglobin was 7.2 for this reason Hold off on extubating until this is worked up. 2. Severe sepsis hemodynamics are stable we will continue to follow along 3. Chronic kidney disease stage III at baseline we will continue with supportive care 4. Acute encephalopathy no  changes we will continue with present management   I have personally seen and evaluated the patient, evaluated laboratory and imaging results, formulated the assessment and plan and placed orders.  Time 35 minutes patient has persistence of low hemoglobin orally intubated high risk airway The Patient requires high complexity decision making for assessment and support.  Case was discussed on Rounds with the Respiratory Therapy Staff  Allyne Gee, MD [facility] Pulmonary Critical Care Medicine Sleep Medicine

## 2019-05-25 ENCOUNTER — Other Ambulatory Visit (HOSPITAL_COMMUNITY): Payer: Medicare Other

## 2019-05-25 DIAGNOSIS — N183 Chronic kidney disease, stage 3 unspecified: Secondary | ICD-10-CM | POA: Diagnosis not present

## 2019-05-25 DIAGNOSIS — A419 Sepsis, unspecified organism: Secondary | ICD-10-CM | POA: Diagnosis not present

## 2019-05-25 DIAGNOSIS — J9621 Acute and chronic respiratory failure with hypoxia: Secondary | ICD-10-CM | POA: Diagnosis not present

## 2019-05-25 DIAGNOSIS — G9340 Encephalopathy, unspecified: Secondary | ICD-10-CM | POA: Diagnosis not present

## 2019-05-25 LAB — TYPE AND SCREEN
ABO/RH(D): O POS
Antibody Screen: NEGATIVE
Unit division: 0
Unit division: 0
Unit division: 0

## 2019-05-25 LAB — RENAL FUNCTION PANEL
Albumin: 1.7 g/dL — ABNORMAL LOW (ref 3.5–5.0)
Anion gap: 10 (ref 5–15)
BUN: 93 mg/dL — ABNORMAL HIGH (ref 8–23)
CO2: 23 mmol/L (ref 22–32)
Calcium: 7.9 mg/dL — ABNORMAL LOW (ref 8.9–10.3)
Chloride: 107 mmol/L (ref 98–111)
Creatinine, Ser: 2.06 mg/dL — ABNORMAL HIGH (ref 0.61–1.24)
GFR calc Af Amer: 38 mL/min — ABNORMAL LOW (ref >60)
GFR calc non Af Amer: 33 mL/min — ABNORMAL LOW (ref >60)
Glucose, Bld: 197 mg/dL — ABNORMAL HIGH (ref 70–99)
Phosphorus: 4.7 mg/dL — ABNORMAL HIGH (ref 2.5–4.6)
Potassium: 5.3 mmol/L — ABNORMAL HIGH (ref 3.5–5.1)
Sodium: 140 mmol/L (ref 135–145)

## 2019-05-25 LAB — BPAM RBC
Blood Product Expiration Date: 202101072359
Blood Product Expiration Date: 202101182359
Blood Product Expiration Date: 202101202359
ISSUE DATE / TIME: 202012241632
ISSUE DATE / TIME: 202012261022
ISSUE DATE / TIME: 202012271235
Unit Type and Rh: 5100
Unit Type and Rh: 5100
Unit Type and Rh: 5100

## 2019-05-25 LAB — PROCALCITONIN: Procalcitonin: 0.16 ng/mL

## 2019-05-25 LAB — MAGNESIUM: Magnesium: 2.5 mg/dL — ABNORMAL HIGH (ref 1.7–2.4)

## 2019-05-25 LAB — CBC
HCT: 25.1 % — ABNORMAL LOW (ref 39.0–52.0)
Hemoglobin: 7.7 g/dL — ABNORMAL LOW (ref 13.0–17.0)
MCH: 29.7 pg (ref 26.0–34.0)
MCHC: 30.7 g/dL (ref 30.0–36.0)
MCV: 96.9 fL (ref 80.0–100.0)
Platelets: 90 10*3/uL — ABNORMAL LOW (ref 150–400)
RBC: 2.59 MIL/uL — ABNORMAL LOW (ref 4.22–5.81)
RDW: 17.1 % — ABNORMAL HIGH (ref 11.5–15.5)
WBC: 5.9 10*3/uL (ref 4.0–10.5)
nRBC: 0 % (ref 0.0–0.2)

## 2019-05-25 LAB — SEDIMENTATION RATE: Sed Rate: 44 mm/hr — ABNORMAL HIGH (ref 0–16)

## 2019-05-25 LAB — C-REACTIVE PROTEIN: CRP: 3.3 mg/dL — ABNORMAL HIGH (ref <1.0)

## 2019-05-25 MED ORDER — GENERIC EXTERNAL MEDICATION
Status: DC
Start: ? — End: 2019-05-25

## 2019-05-25 NOTE — Consult Note (Addendum)
Referring Provider:  Dr. Sharyon Medicus Primary Care Physician:  Patient, No Pcp Per Primary Gastroenterologist: Gentry Fitz  Reason for Consultation: Heme positive stool  HPI: James Merritt is a 63 y.o. 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  Pulmonary critical care planning for possible extubation tomorrow morning.  Patient was found to have mild drop in hemoglobin with heme positive stool.  GI is consulted for further evaluation.  Hemoglobin on May 15, 2019 was 8.9, it was 6.6 on December 24. Stool for occult blood positive on May 22, 2019.  Has received blood transfusion. Hgb 7.7 today.  Platelet count was normal before but has dropped to 90 today.    Patient seen and examined at bedside.  Not able to obtain any history from patient.  Discussed with RN.  Patient does not have any overt bleeding since admission.  Having green-colored stool since last 2 days.  Tolerating tube feeding.  Past Medical History:  Diagnosis Date  . Acute encephalopathy   . Acute on chronic respiratory failure with hypoxia (HCC)   . Chronic kidney disease, stage III (moderate)   . Severe sepsis [facility])    PAST MEDICAL HISTORY Past Medical History:  Diagnosis Date  . Anxiety  . Chronic renal impairment, stage 3 (moderate) (HCC) 12/14/2017  . COPD (chronic obstructive pulmonary disease) (HCC)  . Diabetes mellitus (HCC)  . GERD (gastroesophageal reflux disease)  . Gout  . Hepatitis C antibody test positive 01/06/2019  04/19/2018  . 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 Gated images reveal LVEF = 32%. This represents a abnormal functional finding. This is a high risk study due to LVEF   . Hypertension  . Seizures (HCC)    PAST SURGICAL HISTORY Past Surgical History:  Procedure Laterality Date  . HX CORONARY ARTERY BYPASS GRAFT 11/2017  . HX FOOT SURGERY  . HX HAND LIGAMENT RECONSTRUCTION  . HX KNEE CARTILAGE SURGERY  . HX PTCA   ALLERGIES Allergies  Allergen Reactions  . Clonidine Unknown  Other reaction(s): Unknown Pt denies allergy Pt denies allergy  . Methadone Unknown  Other reaction(s): Unknown Pt denies allergy Pt denies allergy    Prior to Admission medications   Not on File    Scheduled Meds: Continuous Infusions: PRN Meds:.  No family history on file.  Social History   Socioeconomic History  . Marital status: Single    Spouse name: Not on file  . Number of children: Not on file  . Years of education: Not on file  . Highest education level: Not on file  Occupational History  . Not on file  Tobacco Use  . Smoking status: Not on file  Substance and Sexual Activity  . Alcohol use: Not on file  . Drug use: Not on file  . Sexual activity: Not on file  Other Topics Concern  . Not on file  Social History Narrative  . Not on file   Social Determinants of Health   Financial Resource Strain:   . Difficulty of Paying Living Expenses:  Not on file  Food Insecurity:   . Worried About Programme researcher, broadcasting/film/videounning Out of Food in the Last Year: Not on file  . Ran Out of Food in the Last Year: Not on file  Transportation Needs:   . Lack of Transportation (Medical): Not on file  . Lack of Transportation (Non-Medical): Not on file  Physical Activity:   . Days of Exercise per Week: Not on file  . Minutes of Exercise per Session: Not on file  Stress:   . Feeling of Stress : Not on file  Social Connections:   . Frequency  of Communication with Friends and Family: Not on file  . Frequency of Social Gatherings with Friends and Family: Not on file  . Attends Religious Services: Not on file  . Active Member of Clubs or Organizations: Not on file  . Attends BankerClub or Organization Meetings: Not on file  . Marital Status: Not on file  Intimate Partner Violence:   . Fear of Current or Ex-Partner: Not on file  . Emotionally Abused: Not on file  . Physically Abused: Not on file  . Sexually Abused: Not on file    Review of Systems: Not able to obtain as patient is intubated.  Physical Exam: Vital signs: There were no vitals filed for this visit.   General:   Intubated. HEENT : Intubated and on mechanical ventilation.  NG tube in place. Lungs: Good air entry bilaterally. Heart:  Regular rate and rhythm; no murmurs, clicks, rubs,  or gallops. Abdomen:  Mild distended, nontender, bowel sounds present.  No peritoneal signs LE : Edema  Noted  Neuro : Not able to obtain Psych.  Unable to obtain  GI:  Lab Results: Recent Labs    05/23/19 1544 05/24/19 0500 05/25/19 0500  WBC 6.5 5.0 5.9  HGB 7.6* 7.2* 7.7*  HCT 23.9* 23.1* 25.1*  PLT 92* 82* 90*   BMET Recent Labs    05/24/19 0500  NA 141  K 4.5  CL 110  CO2 25  GLUCOSE 141*  BUN 80*  CREATININE 1.63*  CALCIUM 8.0*   LFT Recent Labs    05/24/19 0500  ALBUMIN 1.7*   PT/INR No results for input(s): LABPROT, INR in the last 72 hours.   Studies/Results: DG CHEST PORT 1 VIEW  Result Date: 05/25/2019 CLINICAL DATA:  Respiratory failure. EXAM: PORTABLE CHEST 1 VIEW COMPARISON:  05/19/2019 FINDINGS: The endotracheal tube is 3.9 cm above the carina. The feeding tube is coursing down the esophagus and into the stomach. The right PICC line is stable with the tip in the distal SVC. Persistent diffuse interstitial and airspace process in the lungs along with probable bilateral pleural effusions. IMPRESSION: 1. Stable support apparatus. 2. Persistent  interstitial and airspace process in the lungs and probable bilateral effusions. Electronically Signed   By: Rudie MeyerP.  Gallerani M.D.   On: 05/25/2019 07:02    Impression/Plan: -Anemia with occult blood positive stool.  No overt bleeding.  Having green-colored stool.  Status post blood transfusion.  -Thrombocytopenia. ?  Sepsis versus DIC. -Acute on chronic respiratory failure.  Currently intubated -History of polysubstance abuse -History of hepatitis C - sepsis/right upper extremity cellulitis -History of coronary artery disease.  Was on aspirin and Plavix.  Recommendations ------------------------ -Continue Protonix IV for now. -No plans for acute endoscopic evaluation given lack of overt bleeding. -Consider work-up for DIC if not already done. -Check PT/INR and CBC in the morning. -Okay to resume aspirin and Plavix tomorrow from GI standpoint if hemoglobin  remained stable. -GI will follow    LOS: 0 days   Otis Brace  MD, FACP 05/25/2019, 11:18 AM  Contact #  [phone]

## 2019-05-25 NOTE — Progress Notes (Signed)
Pulmonary Critical Care Medicine [facility] [place]   PULMONARY CRITICAL CARE SERVICE  PROGRESS NOTE  Date of Service: 05/25/2019  James Merritt  [id number]  DOB: March 10, 1956   DOA: 2019-05-17  Referring Physician: Carron Curie, MD  HPI: James Merritt is a 63 y.o. male seen for follow up of Acute on Chronic Respiratory Failure.  Patient remains orally intubated today will be completing 24 hours on the pressure support 12/5 volumes are good so hopefully we should be able to advance towards extubation  Medications: Reviewed on Rounds  Physical Exam:  Vitals: Temperature 99.6 pulse 90 respiratory 18 blood pressure 100/80 saturations 92%  Ventilator Settings mode ventilation pressure support FiO2 28% pressure 12 PEEP 5 tidal volume 460  . General: Comfortable at this time . Eyes: Grossly normal lids, irises & conjunctiva . ENT: grossly tongue is normal . Neck: no obvious mass . Cardiovascular: S1 S2 normal no gallop . Respiratory: No rhonchi coarse breath sounds are noted at this time . Abdomen: soft . Skin: no rash seen on limited exam . Musculoskeletal: not rigid . Psychiatric:unable to assess . Neurologic: no seizure no involuntary movements         Lab Data:   Basic Metabolic Panel: Recent Labs  Lab 05/19/19 0501 05/20/19 0800 05/21/19 0500 05/22/19 0500 05/24/19 0500 05/25/19 0500  NA 144 147* 142 142 141  --   K 4.1 4.1 3.9 4.2 4.5  --   CL 111 115* 110 113* 110  --   CO2 23 25 24 25 25   --   GLUCOSE 214* 200* 231* 163* 141*  --   BUN 55* 57* 58* 65* 80*  --   CREATININE 1.55* 1.37* 1.27* 1.29* 1.63*  --   CALCIUM 8.3* 8.2* 8.0* 8.0* 8.0*  --   MG 2.4 2.2  --  2.4 2.4 2.5*  PHOS 3.2 3.0 3.1 3.7 4.4  --     ABG: No results for input(s): PHART, PCO2ART, PO2ART, HCO3, O2SAT in the last 168 hours.  Liver Function Tests: Recent Labs  Lab 05/19/19 0501 05/20/19 0800 05/21/19 0500 05/24/19 0500  ALBUMIN 1.8* 1.7* 1.7* 1.7*   No  results for input(s): LIPASE, AMYLASE in the last 168 hours. No results for input(s): AMMONIA in the last 168 hours.  CBC: Recent Labs  Lab 05/22/19 0500 05/23/19 0500 05/23/19 1544 05/24/19 0500 05/25/19 0500  WBC 5.9 6.5 6.5 5.0 5.9  HGB 7.4* 6.9* 7.6* 7.2* 7.7*  HCT 23.2* 22.1* 23.9* 23.1* 25.1*  MCV 94.7 97.4 96.8 97.5 96.9  PLT 120* 95* 92* 82* 90*    Cardiac Enzymes: No results for input(s): CKTOTAL, CKMB, CKMBINDEX, TROPONINI in the last 168 hours.  BNP (last 3 results) No results for input(s): BNP in the last 8760 hours.  ProBNP (last 3 results) No results for input(s): PROBNP in the last 8760 hours.  Radiological Exams: DG CHEST PORT 1 VIEW  Result Date: 05/25/2019 CLINICAL DATA:  Respiratory failure. EXAM: PORTABLE CHEST 1 VIEW COMPARISON:  05/19/2019 FINDINGS: The endotracheal tube is 3.9 cm above the carina. The feeding tube is coursing down the esophagus and into the stomach. The right PICC line is stable with the tip in the distal SVC. Persistent diffuse interstitial and airspace process in the lungs along with probable bilateral pleural effusions. IMPRESSION: 1. Stable support apparatus. 2. Persistent interstitial and airspace process in the lungs and probable bilateral effusions. Electronically Signed   By: 05/21/2019 M.D.   On: 05/25/2019 07:02  Assessment/Plan Active Problems:   Acute on chronic respiratory failure with hypoxia (HCC)   Severe sepsis (HCC)   Chronic kidney disease, stage III (moderate)   Acute encephalopathy   1. Acute on chronic respiratory failure hypoxia plan is to continue with pressure support mode titrate oxygen continue pulmonary toilet planning on possibly extubating in the morning 2. Severe sepsis resolved we will continue with present management 3. Chronic kidney disease stage III follow labs 4. Encephalopathy patient is at baseline   I have personally seen and evaluated the patient, evaluated laboratory and imaging  results, formulated the assessment and plan and placed orders. The Patient requires high complexity decision making for assessment and support.  Case was discussed on Rounds with the Respiratory Therapy Staff  Allyne Gee, MD [facility] Pulmonary Critical Care Medicine Sleep Medicine

## 2019-05-26 ENCOUNTER — Other Ambulatory Visit (HOSPITAL_COMMUNITY): Payer: Medicare Other

## 2019-05-26 DIAGNOSIS — R042 Hemoptysis: Secondary | ICD-10-CM

## 2019-05-26 DIAGNOSIS — J9621 Acute and chronic respiratory failure with hypoxia: Secondary | ICD-10-CM | POA: Diagnosis not present

## 2019-05-26 DIAGNOSIS — A419 Sepsis, unspecified organism: Secondary | ICD-10-CM | POA: Diagnosis not present

## 2019-05-26 DIAGNOSIS — N183 Chronic kidney disease, stage 3 unspecified: Secondary | ICD-10-CM | POA: Diagnosis not present

## 2019-05-26 DIAGNOSIS — G9340 Encephalopathy, unspecified: Secondary | ICD-10-CM | POA: Diagnosis not present

## 2019-05-26 LAB — CBC
HCT: 25.3 % — ABNORMAL LOW (ref 39.0–52.0)
HCT: 24.6 % — ABNORMAL LOW (ref 39.0–52.0)
Hemoglobin: 7.9 g/dL — ABNORMAL LOW (ref 13.0–17.0)
Hemoglobin: 7.5 g/dL — ABNORMAL LOW (ref 13.0–17.0)
MCH: 30.4 pg (ref 26.0–34.0)
MCH: 29.9 pg (ref 26.0–34.0)
MCHC: 31.2 g/dL (ref 30.0–36.0)
MCHC: 30.5 g/dL (ref 30.0–36.0)
MCV: 97.3 fL (ref 80.0–100.0)
MCV: 98 fL (ref 80.0–100.0)
Platelets: 80 10*3/uL — ABNORMAL LOW (ref 150–400)
Platelets: 78 10*3/uL — ABNORMAL LOW (ref 150–400)
RBC: 2.6 MIL/uL — ABNORMAL LOW (ref 4.22–5.81)
RBC: 2.51 MIL/uL — ABNORMAL LOW (ref 4.22–5.81)
RDW: 16.8 % — ABNORMAL HIGH (ref 11.5–15.5)
RDW: 16.6 % — ABNORMAL HIGH (ref 11.5–15.5)
WBC: 5.4 10*3/uL (ref 4.0–10.5)
WBC: 5.1 10*3/uL (ref 4.0–10.5)
nRBC: 0 % (ref 0.0–0.2)
nRBC: 0 % (ref 0.0–0.2)

## 2019-05-26 LAB — BASIC METABOLIC PANEL
Anion gap: 7 (ref 5–15)
BUN: 98 mg/dL — ABNORMAL HIGH (ref 8–23)
CO2: 25 mmol/L (ref 22–32)
Calcium: 8.1 mg/dL — ABNORMAL LOW (ref 8.9–10.3)
Chloride: 111 mmol/L (ref 98–111)
Creatinine, Ser: 2.11 mg/dL — ABNORMAL HIGH (ref 0.61–1.24)
GFR calc Af Amer: 37 mL/min — ABNORMAL LOW (ref >60)
GFR calc non Af Amer: 32 mL/min — ABNORMAL LOW (ref >60)
Glucose, Bld: 92 mg/dL (ref 70–99)
Potassium: 4.7 mmol/L (ref 3.5–5.1)
Sodium: 143 mmol/L (ref 135–145)

## 2019-05-26 LAB — PROTIME-INR
INR: 1.1 (ref 0.8–1.2)
Prothrombin Time: 14 seconds (ref 11.4–15.2)

## 2019-05-26 LAB — DIC (DISSEMINATED INTRAVASCULAR COAGULATION)PANEL
D-Dimer, Quant: 2.84 ug/mL-FEU — ABNORMAL HIGH (ref 0.00–0.50)
Fibrinogen: 496 mg/dL — ABNORMAL HIGH (ref 210–475)
INR: 1.1 (ref 0.8–1.2)
Platelets: 80 10*3/uL — ABNORMAL LOW (ref 150–400)
Prothrombin Time: 13.9 seconds (ref 11.4–15.2)
Smear Review: NONE SEEN
aPTT: 29 seconds (ref 24–36)

## 2019-05-26 LAB — MAGNESIUM: Magnesium: 2.4 mg/dL (ref 1.7–2.4)

## 2019-05-26 MED FILL — Medication: Qty: 1 | Status: AC

## 2019-05-26 NOTE — Progress Notes (Signed)
Pulmonary Critical Care Medicine [facility] [place]   PULMONARY CRITICAL CARE SERVICE  PROGRESS NOTE  Date of Service: 05/26/2019  James Merritt  MEQ:[id number]  DOB: 1956/05/01   DOA: 2019/05/23  Referring Physician: Carron Curie, MD  HPI: James Merritt is a 63 y.o. male seen for follow up of Acute on Chronic Respiratory Failure.  Patient apparently has been having some blood coming out from the ET tube had been also having heme positive stools was seen by gastroenterology consult note appreciated patient is also been on blood thinners and has had issues with low platelet count which may be contributing to the bleeding issues.  Medications: Reviewed on Rounds  Physical Exam:  Vitals: Temperature 95.6 pulse 76 respiratory 24 blood pressure is 119/74 saturations 96%  Ventilator Settings orally intubated on the ventilator patient is currently on assist control FiO2 35% tidal volume 537 PEEP 5  . General: Comfortable at this time . Eyes: Grossly normal lids, irises & conjunctiva . ENT: grossly tongue is normal . Neck: no obvious mass . Cardiovascular: S1 S2 normal no gallop . Respiratory: Scattered rhonchi expansion is equal at this time . Abdomen: soft . Skin: no rash seen on limited exam . Musculoskeletal: not rigid . Psychiatric:unable to assess . Neurologic: no seizure no involuntary movements         Lab Data:   Basic Metabolic Panel: Recent Labs  Lab 05/20/19 0800 05/21/19 0500 05/22/19 0500 05/24/19 0500 05/25/19 0500 05/26/19 0500  NA 147* 142 142 141 140 143  K 4.1 3.9 4.2 4.5 5.3* 4.7  CL 115* 110 113* 110 107 111  CO2 25 24 25 25 23 25   GLUCOSE 200* 231* 163* 141* 197* 92  BUN 57* 58* 65* 80* 93* 98*  CREATININE 1.37* 1.27* 1.29* 1.63* 2.06* 2.11*  CALCIUM 8.2* 8.0* 8.0* 8.0* 7.9* 8.1*  MG 2.2  --  2.4 2.4 2.5* 2.4  PHOS 3.0 3.1 3.7 4.4 4.7*  --     ABG: No results for input(s): PHART, PCO2ART, PO2ART, HCO3, O2SAT in the last 168  hours.  Liver Function Tests: Recent Labs  Lab 05/20/19 0800 05/21/19 0500 05/24/19 0500 05/25/19 0500  ALBUMIN 1.7* 1.7* 1.7* 1.7*   No results for input(s): LIPASE, AMYLASE in the last 168 hours. No results for input(s): AMMONIA in the last 168 hours.  CBC: Recent Labs  Lab 05/23/19 0500 05/23/19 1544 05/24/19 0500 05/25/19 0500 05/26/19 0500  WBC 6.5 6.5 5.0 5.9 5.4  HGB 6.9* 7.6* 7.2* 7.7* 7.9*  HCT 22.1* 23.9* 23.1* 25.1* 25.3*  MCV 97.4 96.8 97.5 96.9 97.3  PLT 95* 92* 82* 90* 80*    Cardiac Enzymes: No results for input(s): CKTOTAL, CKMB, CKMBINDEX, TROPONINI in the last 168 hours.  BNP (last 3 results) No results for input(s): BNP in the last 8760 hours.  ProBNP (last 3 results) No results for input(s): PROBNP in the last 8760 hours.  Radiological Exams: DG CHEST PORT 1 VIEW  Result Date: 05/25/2019 CLINICAL DATA:  Respiratory failure. EXAM: PORTABLE CHEST 1 VIEW COMPARISON:  05/19/2019 FINDINGS: The endotracheal tube is 3.9 cm above the carina. The feeding tube is coursing down the esophagus and into the stomach. The right PICC line is stable with the tip in the distal SVC. Persistent diffuse interstitial and airspace process in the lungs along with probable bilateral pleural effusions. IMPRESSION: 1. Stable support apparatus. 2. Persistent interstitial and airspace process in the lungs and probable bilateral effusions. Electronically Signed   By:  Marijo Sanes M.D.   On: 05/25/2019 07:02    Assessment/Plan Active Problems:   Acute on chronic respiratory failure with hypoxia (HCC)   Severe sepsis (HCC)   Chronic kidney disease, stage III (moderate)   Acute encephalopathy   1. Acute on chronic respiratory failure with hypoxia patient is remains orally intubated has not been able to do well as far as weaning is concerned I recommended that we get a ENT consultation for the possibility of a tracheostomy. 2. Hemoptysis this is likely from suction trauma  patient is being ice lavaged.  Last chest x-ray that was done shows some pleural effusions bilaterally I do not see a diffuse pattern that would be suggestive of diffuse alveolar hemorrhage etc. more likely related to low platelet count and blood thinners and renal dysfunction 3. Severe sepsis treated we will continue supportive care hemodynamics are stable 4. Chronic kidney disease stage III with continue with supportive care follow-up on labs 5. Encephalopathy no changes noted   I have personally seen and evaluated the patient, evaluated laboratory and imaging results, formulated the assessment and plan and placed orders.  Time 35 minutes patient has change in status The Patient requires high complexity decision making for assessment and support.  Case was discussed on Rounds with the Respiratory Therapy Staff  Allyne Gee, MD [facility] Pulmonary Critical Care Medicine Sleep Medicine

## 2019-05-26 NOTE — Progress Notes (Signed)
[facility] Progress Note  DANEIL BEEM 63 y.o. [birth year]  CC: Anemia, heme positive stool   Subjective: Patient seen and examined at bedside.  Not able to obtain any history from patient.  Discussed with nursing staff.  No evidence of GI bleed.  Patient had some bleeding from ET tube after adjusting it.  ROS : Not able to obtain   Objective: Vital signs in last 24 hours: There were no vitals filed for this visit.  Physical Exam:  General.  Resting in the bed comfortably.  Remains intubated Abdomen.  Soft, mild distended, nontender, bowel sounds present.  No peritoneal signs Lab Results: Recent Labs    05/24/19 0500 05/25/19 0500 05/26/19 0500  NA 141 140 143  K 4.5 5.3* 4.7  CL 110 107 111  CO2 25 23 25   GLUCOSE 141* 197* 92  BUN 80* 93* 98*  CREATININE 1.63* 2.06* 2.11*  CALCIUM 8.0* 7.9* 8.1*  MG 2.4 2.5* 2.4  PHOS 4.4 4.7*  --    Recent Labs    05/24/19 0500 05/25/19 0500  ALBUMIN 1.7* 1.7*   Recent Labs    05/25/19 0500 05/26/19 0500  WBC 5.9 5.4  HGB 7.7* 7.9*  HCT 25.1* 25.3*  MCV 96.9 97.3  PLT 90* 80*   Recent Labs    05/26/19 0500  LABPROT 14.0  INR 1.1      Assessment/Plan: -Anemia with occult blood positive stool.  No overt bleeding.  Having green-colored stool.  Status post blood transfusion.  -Thrombocytopenia. ?  Sepsis versus DIC. -Acute on chronic respiratory failure.  Currently intubated -History of polysubstance abuse -History of hepatitis C - sepsis/right upper extremity cellulitis -History of coronary artery disease.  Was on aspirin and Plavix.  Recommendations ------------------------ -Continue Protonix IV for now. -No plans for endoscopic evaluation given lack of overt bleeding. -Hemoglobin stable. -Okay to resume aspirin and Plavix  from GI standpoint  -Recommend outpatient work-up for heme positive stool once acute issues are resolved. -GI will sign off.  Call us back if needed  Otis Brace  MD, [place] 05/26/2019, 9:44 AM  Contact #  [phone]

## 2019-05-27 ENCOUNTER — Other Ambulatory Visit (HOSPITAL_COMMUNITY): Payer: Medicare Other

## 2019-05-27 DIAGNOSIS — J9621 Acute and chronic respiratory failure with hypoxia: Secondary | ICD-10-CM | POA: Diagnosis not present

## 2019-05-27 DIAGNOSIS — J9620 Acute and chronic respiratory failure, unspecified whether with hypoxia or hypercapnia: Secondary | ICD-10-CM

## 2019-05-27 DIAGNOSIS — N183 Chronic kidney disease, stage 3 unspecified: Secondary | ICD-10-CM | POA: Diagnosis not present

## 2019-05-27 DIAGNOSIS — A419 Sepsis, unspecified organism: Secondary | ICD-10-CM | POA: Diagnosis not present

## 2019-05-27 DIAGNOSIS — G9340 Encephalopathy, unspecified: Secondary | ICD-10-CM | POA: Diagnosis not present

## 2019-05-27 LAB — BASIC METABOLIC PANEL
Anion gap: 7 (ref 5–15)
BUN: 109 mg/dL — ABNORMAL HIGH (ref 8–23)
CO2: 25 mmol/L (ref 22–32)
Calcium: 8.2 mg/dL — ABNORMAL LOW (ref 8.9–10.3)
Chloride: 109 mmol/L (ref 98–111)
Creatinine, Ser: 2.46 mg/dL — ABNORMAL HIGH (ref 0.61–1.24)
GFR calc Af Amer: 31 mL/min — ABNORMAL LOW (ref >60)
GFR calc non Af Amer: 26 mL/min — ABNORMAL LOW (ref >60)
Glucose, Bld: 73 mg/dL (ref 70–99)
Potassium: 5.1 mmol/L (ref 3.5–5.1)
Sodium: 141 mmol/L (ref 135–145)

## 2019-05-27 LAB — CBC
HCT: 25.8 % — ABNORMAL LOW (ref 39.0–52.0)
Hemoglobin: 7.8 g/dL — ABNORMAL LOW (ref 13.0–17.0)
MCH: 30 pg (ref 26.0–34.0)
MCHC: 30.2 g/dL (ref 30.0–36.0)
MCV: 99.2 fL (ref 80.0–100.0)
Platelets: 94 10*3/uL — ABNORMAL LOW (ref 150–400)
RBC: 2.6 MIL/uL — ABNORMAL LOW (ref 4.22–5.81)
RDW: 16.7 % — ABNORMAL HIGH (ref 11.5–15.5)
WBC: 5.3 10*3/uL (ref 4.0–10.5)
nRBC: 0 % (ref 0.0–0.2)

## 2019-05-27 LAB — MAGNESIUM: Magnesium: 2.6 mg/dL — ABNORMAL HIGH (ref 1.7–2.4)

## 2019-05-27 NOTE — Progress Notes (Signed)
Pulmonary Critical Care Medicine [place]   PULMONARY CRITICAL CARE SERVICE  PROGRESS NOTE  Date of Service: 05/27/2019  James Merritt  [id number]:[id number]  DOB: [birth year]   DOA: 2019/05/26  Referring Physician: Merton Border, MD  HPI: James Merritt is a 63 y.o. male seen for follow up of Acute on Chronic Respiratory Failure.  Patient remains orally intubated on the ventilator right now is on pressure support mode has been on 28% FiO2.  Primary care team has spoken with the patient's son and apparently they've patient did not want to be intubated in the first place.  Right now patient is weaning on pressure support 12/5 his volumes look very good.  We had requested a tracheostomy to be done in anticipation of prolonged mechanical ventilation.  The hemoptysis that had been seen previously is also improved.  The patient's other issues are now with renal failure with creatinine going up to 2.1 likely with underlying liver disease.  Apparently the son does not want him to be dialyzed  Medications: Reviewed on Rounds  Physical Exam:  Vitals: Temperature 95.4 pulse 62 respiratory rate 18 blood pressure is 102/62 saturations 100%  Ventilator Settings mode ventilation pressure support FiO2 28% tidal volume 694 pressure support 12 PEEP 5  . General: Comfortable at this time . Eyes: Grossly normal lids, irises & conjunctiva . ENT: grossly tongue is normal . Neck: no obvious mass . Cardiovascular: S1 S2 normal no gallop . Respiratory: No rhonchi coarse breath sounds are noted . Abdomen: soft . Skin: no rash seen on limited exam . Musculoskeletal: not rigid . Psychiatric:unable to assess . Neurologic: no seizure no involuntary movements         Lab Data:   Basic Metabolic Panel: Recent Labs  Lab 05/21/19 0500 05/22/19 0500 05/24/19 0500 05/25/19 0500 05/26/19 0500 05/27/19 0500  NA 142 142 141 140 143 141  K 3.9 4.2 4.5 5.3* 4.7 5.1  CL 110 113* 110 107 111  109  CO2 24 25 25 23 25 25   GLUCOSE 231* 163* 141* 197* 92 73  BUN 58* 65* 80* 93* 98* 109*  CREATININE 1.27* 1.29* 1.63* 2.06* 2.11* 2.46*  CALCIUM 8.0* 8.0* 8.0* 7.9* 8.1* 8.2*  MG  --  2.4 2.4 2.5* 2.4 2.6*  PHOS 3.1 3.7 4.4 4.7*  --   --     ABG: No results for input(s): PHART, PCO2ART, PO2ART, HCO3, O2SAT in the last 168 hours.  Liver Function Tests: Recent Labs  Lab 05/21/19 0500 05/24/19 0500 05/25/19 0500  ALBUMIN 1.7* 1.7* 1.7*   No results for input(s): LIPASE, AMYLASE in the last 168 hours. No results for input(s): AMMONIA in the last 168 hours.  CBC: Recent Labs  Lab 05/24/19 0500 05/25/19 0500 05/26/19 0500 05/26/19 1430 05/26/19 1600 05/27/19 0500  WBC 5.0 5.9 5.4  --  5.1 5.3  HGB 7.2* 7.7* 7.9*  --  7.5* 7.8*  HCT 23.1* 25.1* 25.3*  --  24.6* 25.8*  MCV 97.5 96.9 97.3  --  98.0 99.2  PLT 82* 90* 80* 80* 78* 94*    Cardiac Enzymes: No results for input(s): CKTOTAL, CKMB, CKMBINDEX, TROPONINI in the last 168 hours.  BNP (last 3 results) No results for input(s): BNP in the last 8760 hours.  ProBNP (last 3 results) No results for input(s): PROBNP in the last 8760 hours.  Radiological Exams: US RENAL  Result Date: 05/26/2019 CLINICAL DATA:  Acute kidney injury EXAM: RENAL / URINARY TRACT ULTRASOUND COMPLETE  COMPARISON:  None. FINDINGS: Right Kidney: Renal measurements: 8.5 x 3.8 x 3.3 cm. = volume: 58 mL. There is increased cortical echogenicity without evidence for hydronephrosis. Left Kidney: Renal measurements: 8.9 x 5 x 5 cm = volume: 117 mL. There is increased cortical echogenicity without evidence for hydronephrosis. Bladder: The bladder is suboptimally evaluated secondary to the presence of a Foley catheter. Other: A small volume of ascites is noted. IMPRESSION: 1. No hydronephrosis. 2. Echogenic kidneys bilaterally which can be seen in patients with medical renal disease. 3. Small volume abdominal ascites is noted. Electronically Signed   By:  Katherine Mantle M.D.   On: 05/26/2019 23:59   DG CHEST PORT 1 VIEW  Result Date: 05/27/2019 CLINICAL DATA:  Respiratory failure EXAM: PORTABLE CHEST 1 VIEW COMPARISON:  Two days ago FINDINGS: Endotracheal tube tip just below the clavicular heads. The feeding tube at least reaches the stomach. Right PICC with tip at the upper right atrium. Hazy opacification of the bilateral chest attributed to layering pleural fluid and airspace disease. Cardiomegaly. No visible pneumothorax although limited by artifact. Remote right mid clavicle fracture IMPRESSION: 1. Stable hardware positioning. 2. Extensive airspace disease with layering pleural effusions that could be moderate or large. Electronically Signed   By: Marnee Spring M.D.   On: 05/27/2019 07:58    Assessment/Plan Active Problems:   Acute on chronic respiratory failure with hypoxia (HCC)   Severe sepsis (HCC)   Chronic kidney disease, stage III (moderate)   Acute encephalopathy   1. Acute on chronic respiratory failure with hypoxia the plan will be to proceed to weaning I think the patient could potentially be extubated we just need direction from the family that if we do extubate the patient is not to be reintubated.  Patient has been made DNR by the son and it is my understanding that they do not want aggressive measures as far as the renal issues are concerned. 2. Hemoptysis the bleeding is actually subsided and I believe it was likely related to suction trauma so we will continue to monitor this closely 3. Severe sepsis resolved we'll continue with present management 4. Chronic kidney disease worsening renal function family does not want dialysis we will continue with supportive care 5. Acute encephalopathy no change we'll continue present management   I have personally seen and evaluated the patient, evaluated laboratory and imaging results, formulated the assessment and plan and placed orders.  Time 35 minutes The Patient requires  high complexity decision making for assessment and support.  Rounds were done with the Respiratory Therapy Director and Staff therapists and discussed with nursing staff also.  Yevonne Pax, MD [facility] Pulmonary Critical Care Medicine Sleep Medicine

## 2019-05-28 DIAGNOSIS — J9621 Acute and chronic respiratory failure with hypoxia: Secondary | ICD-10-CM | POA: Diagnosis not present

## 2019-05-28 DIAGNOSIS — G9340 Encephalopathy, unspecified: Secondary | ICD-10-CM | POA: Diagnosis not present

## 2019-05-28 DIAGNOSIS — A419 Sepsis, unspecified organism: Secondary | ICD-10-CM | POA: Diagnosis not present

## 2019-05-28 DIAGNOSIS — N183 Chronic kidney disease, stage 3 unspecified: Secondary | ICD-10-CM | POA: Diagnosis not present

## 2019-05-28 LAB — BASIC METABOLIC PANEL
Anion gap: 8 (ref 5–15)
BUN: 113 mg/dL — ABNORMAL HIGH (ref 8–23)
CO2: 24 mmol/L (ref 22–32)
Calcium: 8.2 mg/dL — ABNORMAL LOW (ref 8.9–10.3)
Chloride: 110 mmol/L (ref 98–111)
Creatinine, Ser: 2.62 mg/dL — ABNORMAL HIGH (ref 0.61–1.24)
GFR calc Af Amer: 28 mL/min — ABNORMAL LOW (ref >60)
GFR calc non Af Amer: 25 mL/min — ABNORMAL LOW (ref >60)
Glucose, Bld: 130 mg/dL — ABNORMAL HIGH (ref 70–99)
Potassium: 5.2 mmol/L — ABNORMAL HIGH (ref 3.5–5.1)
Sodium: 142 mmol/L (ref 135–145)

## 2019-05-28 LAB — SARS CORONAVIRUS 2 (TAT 6-24 HRS): SARS Coronavirus 2: NEGATIVE

## 2019-05-28 NOTE — Progress Notes (Signed)
Pulmonary Critical Care Medicine [place]   PULMONARY CRITICAL CARE SERVICE  PROGRESS NOTE  Date of Service: 05/28/2019  James Merritt  ZJI:[id number]  DOB: 1955/06/20   DOA: June 06, 2019  Referring Physician: Merton Border, MD  HPI: James Merritt is a 63 y.o. male seen for follow up of Acute on Chronic Respiratory Failure.  Patient currently is on pressure support mode has been on 40% FiO2 patient is DNR and is on a pressure support of 12/5 right now apparently the family wants the tracheostomy done but no CPR no dialysis at this time  Medications: Reviewed on Rounds  Physical Exam:  Vitals: Temperature 97.8 pulse 68 respiratory rate 18 blood pressure is 108/51 saturations 99 percent  Ventilator Settings mode of ventilation pressure support FiO2 40% tidal volume 452 pressure poor 12 PEEP 5  . General: Comfortable at this time . Eyes: Grossly normal lids, irises & conjunctiva . ENT: grossly tongue is normal . Neck: no obvious mass . Cardiovascular: S1 S2 normal no gallop . Respiratory: No rhonchi coarse breath sounds are noted . Abdomen: soft . Skin: no rash seen on limited exam . Musculoskeletal: not rigid . Psychiatric:unable to assess . Neurologic: no seizure no involuntary movements         Lab Data:   Basic Metabolic Panel: Recent Labs  Lab 05/22/19 0500 05/24/19 0500 05/25/19 0500 05/26/19 0500 05/27/19 0500 05/28/19 1120  NA 142 141 140 143 141 142  K 4.2 4.5 5.3* 4.7 5.1 5.2*  CL 113* 110 107 111 109 110  CO2 25 25 23 25 25 24   GLUCOSE 163* 141* 197* 92 73 130*  BUN 65* 80* 93* 98* 109* 113*  CREATININE 1.29* 1.63* 2.06* 2.11* 2.46* 2.62*  CALCIUM 8.0* 8.0* 7.9* 8.1* 8.2* 8.2*  MG 2.4 2.4 2.5* 2.4 2.6*  --   PHOS 3.7 4.4 4.7*  --   --   --     ABG: No results for input(s): PHART, PCO2ART, PO2ART, HCO3, O2SAT in the last 168 hours.  Liver Function Tests: Recent Labs  Lab 05/24/19 0500 05/25/19 0500  ALBUMIN 1.7* 1.7*    No results for input(s): LIPASE, AMYLASE in the last 168 hours. No results for input(s): AMMONIA in the last 168 hours.  CBC: Recent Labs  Lab 05/24/19 0500 05/25/19 0500 05/26/19 0500 05/26/19 1430 05/26/19 1600 05/27/19 0500  WBC 5.0 5.9 5.4  --  5.1 5.3  HGB 7.2* 7.7* 7.9*  --  7.5* 7.8*  HCT 23.1* 25.1* 25.3*  --  24.6* 25.8*  MCV 97.5 96.9 97.3  --  98.0 99.2  PLT 82* 90* 80* 80* 78* 94*    Cardiac Enzymes: No results for input(s): CKTOTAL, CKMB, CKMBINDEX, TROPONINI in the last 168 hours.  BNP (last 3 results) No results for input(s): BNP in the last 8760 hours.  ProBNP (last 3 results) No results for input(s): PROBNP in the last 8760 hours.  Radiological Exams: US RENAL  Result Date: 05/26/2019 CLINICAL DATA:  Acute kidney injury EXAM: RENAL / URINARY TRACT ULTRASOUND COMPLETE COMPARISON:  None. FINDINGS: Right Kidney: Renal measurements: 8.5 x 3.8 x 3.3 cm. = volume: 58 mL. There is increased cortical echogenicity without evidence for hydronephrosis. Left Kidney: Renal measurements: 8.9 x 5 x 5 cm = volume: 117 mL. There is increased cortical echogenicity without evidence for hydronephrosis. Bladder: The bladder is suboptimally evaluated secondary to the presence of a Foley catheter. Other: A small volume of ascites is noted. IMPRESSION: 1. No  hydronephrosis. 2. Echogenic kidneys bilaterally which can be seen in patients with medical renal disease. 3. Small volume abdominal ascites is noted. Electronically Signed   By: Katherine Mantle M.D.   On: 05/26/2019 23:59   DG CHEST PORT 1 VIEW  Result Date: 05/27/2019 CLINICAL DATA:  Respiratory failure EXAM: PORTABLE CHEST 1 VIEW COMPARISON:  Two days ago FINDINGS: Endotracheal tube tip just below the clavicular heads. The feeding tube at least reaches the stomach. Right PICC with tip at the upper right atrium. Hazy opacification of the bilateral chest attributed to layering pleural fluid and airspace disease.  Cardiomegaly. No visible pneumothorax although limited by artifact. Remote right mid clavicle fracture IMPRESSION: 1. Stable hardware positioning. 2. Extensive airspace disease with layering pleural effusions that could be moderate or large. Electronically Signed   By: Marnee Spring M.D.   On: 05/27/2019 07:58    Assessment/Plan Active Problems:   Acute on chronic respiratory failure with hypoxia (HCC)   Severe sepsis (HCC)   Chronic kidney disease, stage III (moderate)   Acute encephalopathy   1. Acute on chronic respiratory failure with hypoxia plan is to continue with pressure support mode currently on 40% FiO2 good saturations are noted at this time patient is currently on 12/5 we are going to try to continue to wean the patient as tolerated.  The last chest film that was done shows still extensive airspace disease along with pleural effusion 2. Severe sepsis right now hemodynamics are stable we will continue with present therapy supportive care 3. Chronic kidney disease stage III we will continue to follow 4. Acute encephalopathy no changes are noted we will continue present therapy   I have personally seen and evaluated the patient, evaluated laboratory and imaging results, formulated the assessment and plan and placed orders. The Patient requires high complexity decision making for assessment and support.  Rounds were done with the Respiratory Therapy Director and Staff therapists and discussed with nursing staff also.  Yevonne Pax, MD [facility] Pulmonary Critical Care Medicine Sleep Medicine

## 2019-05-29 DIAGNOSIS — J9621 Acute and chronic respiratory failure with hypoxia: Secondary | ICD-10-CM | POA: Diagnosis not present

## 2019-05-29 DIAGNOSIS — G9340 Encephalopathy, unspecified: Secondary | ICD-10-CM | POA: Diagnosis not present

## 2019-05-29 DIAGNOSIS — A419 Sepsis, unspecified organism: Secondary | ICD-10-CM | POA: Diagnosis not present

## 2019-05-29 DIAGNOSIS — N183 Chronic kidney disease, stage 3 unspecified: Secondary | ICD-10-CM | POA: Diagnosis not present

## 2019-05-29 LAB — CBC
HCT: 25.2 % — ABNORMAL LOW (ref 39.0–52.0)
Hemoglobin: 7.6 g/dL — ABNORMAL LOW (ref 13.0–17.0)
MCH: 30.2 pg (ref 26.0–34.0)
MCHC: 30.2 g/dL (ref 30.0–36.0)
MCV: 100 fL (ref 80.0–100.0)
Platelets: 98 10*3/uL — ABNORMAL LOW (ref 150–400)
RBC: 2.52 MIL/uL — ABNORMAL LOW (ref 4.22–5.81)
RDW: 16.5 % — ABNORMAL HIGH (ref 11.5–15.5)
WBC: 4.1 10*3/uL (ref 4.0–10.5)
nRBC: 0 % (ref 0.0–0.2)

## 2019-05-29 LAB — RENAL FUNCTION PANEL
Albumin: 2.2 g/dL — ABNORMAL LOW (ref 3.5–5.0)
Anion gap: 9 (ref 5–15)
BUN: 112 mg/dL — ABNORMAL HIGH (ref 8–23)
CO2: 25 mmol/L (ref 22–32)
Calcium: 8.4 mg/dL — ABNORMAL LOW (ref 8.9–10.3)
Chloride: 109 mmol/L (ref 98–111)
Creatinine, Ser: 2.73 mg/dL — ABNORMAL HIGH (ref 0.61–1.24)
GFR calc Af Amer: 27 mL/min — ABNORMAL LOW (ref >60)
GFR calc non Af Amer: 23 mL/min — ABNORMAL LOW (ref >60)
Glucose, Bld: 133 mg/dL — ABNORMAL HIGH (ref 70–99)
Phosphorus: 6.6 mg/dL — ABNORMAL HIGH (ref 2.5–4.6)
Potassium: 5.2 mmol/L — ABNORMAL HIGH (ref 3.5–5.1)
Sodium: 143 mmol/L (ref 135–145)

## 2019-05-29 NOTE — Progress Notes (Signed)
Pulmonary Critical Care Medicine [place]   PULMONARY CRITICAL CARE SERVICE  PROGRESS NOTE  Date of Service: 05/29/2019  James Merritt  QAS:[id number]  DOB: 06-12-1955   DOA: 2019/06/10  Referring Physician: Merton Border, MD  HPI: James Merritt is a 64 y.o. male seen for follow up of Acute on Chronic Respiratory Failure.  Patient remains orally intubated on the ventilator was on pressure support 12/5 this morning his tidal volumes are running about mid 500s discussed with the nephrology team and they are okay with proceeding to extubation.  His volumes are good his RSB I is good  Medications: Reviewed on Rounds  Physical Exam:  Vitals: Temperature 97.6 pulse 100 respiratory rate 15 blood pressure 152/61 saturations are 98%  Ventilator Settings mode ventilation pressure support FiO2 28% pressure poor 12 PEEP 5 tidal volume 554 cc  . General: Comfortable at this time . Eyes: Grossly normal lids, irises & conjunctiva . ENT: grossly tongue is normal . Neck: no obvious mass . Cardiovascular: S1 S2 normal no gallop . Respiratory: Coarse breath sounds with few scattered rhonchi . Abdomen: soft . Skin: no rash seen on limited exam . Musculoskeletal: not rigid . Psychiatric:unable to assess . Neurologic: no seizure no involuntary movements         Lab Data:   Basic Metabolic Panel: Recent Labs  Lab 05/24/19 0500 05/25/19 0500 05/26/19 0500 05/27/19 0500 05/28/19 1120 05/29/19 0449  NA 141 140 143 141 142 143  K 4.5 5.3* 4.7 5.1 5.2* 5.2*  CL 110 107 111 109 110 109  CO2 25 23 25 25 24 25   GLUCOSE 141* 197* 92 73 130* 133*  BUN 80* 93* 98* 109* 113* 112*  CREATININE 1.63* 2.06* 2.11* 2.46* 2.62* 2.73*  CALCIUM 8.0* 7.9* 8.1* 8.2* 8.2* 8.4*  MG 2.4 2.5* 2.4 2.6*  --   --   PHOS 4.4 4.7*  --   --   --  6.6*    ABG: No results for input(s): PHART, PCO2ART, PO2ART, HCO3, O2SAT in the last 168 hours.  Liver Function Tests: Recent Labs  Lab  05/24/19 0500 05/25/19 0500 05/29/19 0449  ALBUMIN 1.7* 1.7* 2.2*   No results for input(s): LIPASE, AMYLASE in the last 168 hours. No results for input(s): AMMONIA in the last 168 hours.  CBC: Recent Labs  Lab 05/25/19 0500 05/26/19 0500 05/26/19 1430 05/26/19 1600 05/27/19 0500 05/29/19 0449  WBC 5.9 5.4  --  5.1 5.3 4.1  HGB 7.7* 7.9*  --  7.5* 7.8* 7.6*  HCT 25.1* 25.3*  --  24.6* 25.8* 25.2*  MCV 96.9 97.3  --  98.0 99.2 100.0  PLT 90* 80* 80* 78* 94* 98*    Cardiac Enzymes: No results for input(s): CKTOTAL, CKMB, CKMBINDEX, TROPONINI in the last 168 hours.  BNP (last 3 results) No results for input(s): BNP in the last 8760 hours.  ProBNP (last 3 results) No results for input(s): PROBNP in the last 8760 hours.  Radiological Exams: No results found.  Assessment/Plan Active Problems:   Acute on chronic respiratory failure with hypoxia (HCC)   Severe sepsis (HCC)   Chronic kidney disease, stage III (moderate)   Acute encephalopathy   1. Acute on chronic respiratory failure with hypoxia patient is doing better numbers are looking good remains orally intubated we will proceed to extubation and place the patient on BiPAP for at least 24 hours to see how he does. 2. Acute renal failure on chronic renal failure followed  by nephrology will continue with their recommendations 3. Severe sepsis hemodynamics are stable 4. Acute encephalopathy patient is unchanged we will continue with supportive care.   I have personally seen and evaluated the patient, evaluated laboratory and imaging results, formulated the assessment and plan and placed orders. The Patient requires high complexity decision making for assessment and support.  Rounds were done with the Respiratory Therapy Director and Staff therapists and discussed with nursing staff also.  Time 35 minutes  Yevonne Pax, MD [facility] Pulmonary Critical Care Medicine Sleep Medicine

## 2019-05-29 NOTE — Consult Note (Signed)
[facility] CONSULT NOTE    Date: 05/29/2019                  Patient Name:  James Merritt  MRN: [id number]  DOB: 1955-06-20  Age / Sex: 64 y.o., male         PCP: Patient, No Pcp Per                 Service Requesting Consult: Hospitalist                 Reason for Consult:  Acute kidney injury            History of Present Illness: Patient is a 64 y.o. male with a PMHx of left arm wound with tendon injury, recent acute respiratory failure, acute metabolic encephalopathy, acute kidney injury, pneumonia, metabolic acidosis, diabetes mellitus type 2, chronic kidney disease stage III baseline creatinine 1.2, coronary artery disease status post CABG, hyperlipidemia,, hypertension, GERD, chronic systolic heart failure who was admitted to [facility] on June 06, 2019 for ongoing management.  At the outside hospital he experienced acute respiratory failure with hypoxia and bradycardia.  He required mechanical ventilation.  He also had cellulitis of the left upper extremity and underwent I&D.  He also experienced acute kidney injury at the outside hospital but this improved.  Over the past several days however his renal function has deteriorated.  BUN currently up to 112 with a creatinine of 2.73.  It appears that the patient's family is not interested in renal replacement therapy.  Renal ultrasound was performed and was negative for hydronephrosis.   Medications:  Current medications: Albumin 12.5 g daily, Humalog sliding scale, amlodipine 10 mg daily, Augmentin 875 mg twice daily, Lipitor 40 mg nightly, clonazepam 0.5 mg every 6 hours, doxycycline 100 mg twice daily, ferrous sulfate 300 mg twice daily, hydralazine 25 mg every 6 hours, Lantus 23 units subcutaneous twice daily, Keppra 1000 mg twice daily, morphine 15 mg every 6 hours, nicotine 7 mg topical daily, olanzapine 5 mg nightly, paroxetine 20 mg daily, prostat 30 cc 3 times daily, Protonix 40 mg twice daily, Flomax 0.4 mg  daily, multivitamin 1 tablet daily     Allergies: Not on File    Past Medical History: left arm wound with tendon injury, recent acute respiratory failure, acute metabolic encephalopathy, acute kidney injury, pneumonia, metabolic acidosis, diabetes mellitus type 2, chronic kidney disease stage III baseline creatinine 1.2, coronary artery disease status post CABG, hyperlipidemia,, hypertension, GERD, chronic systolic heart failure, hepatitis C  Past Surgical History: Incision and drainage of left upper extremity Coronary disease status post CABG History of foot surgery  Family History: Unable to obtain family history from the patient as he is currently on the ventilator.  Social History: Unable to obtain from patient as he is currently on the ventilator  Review of Systems: Unable to obtain review of systems from the patient as he is currently on the ventilator.  Vital Signs: Temperature 97.6 pulse 100 respirations 15 blood pressure 152/61 Weight trends: There were no vitals filed for this visit.  Physical Exam: General: Critically ill appearing  Head: Normocephalic, atraumatic.  Eyes: Anicteric, EOMI  Nose: Mucous membranes moist, not inflammed, nonerythematous.  Throat: Endotracheal tube in place  Neck: Supple, trachea midline.  Lungs:   Scattered rhonchi, vent assisted  Heart: RRR. S1 and S2 normal without gallop, murmur, or rubs.  Abdomen:  BS normoactive. Soft, Nondistended, non-tender.  No masses or organomegaly.  Extremities: 1+ LE  edema  Neurologic: Arousable but not following commands  Skin: Warm, dry    Lab results: Basic Metabolic Panel: Recent Labs  Lab 05/24/19 0500 05/25/19 0500 05/26/19 0500 05/27/19 0500 05/28/19 1120 05/29/19 0449  NA 141 140 143 141 142 143  K 4.5 5.3* 4.7 5.1 5.2* 5.2*  CL 110 107 111 109 110 109  CO2 25 23 25 25 24 25   GLUCOSE 141* 197* 92 73 130* 133*  BUN 80* 93* 98* 109* 113* 112*  CREATININE 1.63* 2.06* 2.11* 2.46*  2.62* 2.73*  CALCIUM 8.0* 7.9* 8.1* 8.2* 8.2* 8.4*  MG 2.4 2.5* 2.4 2.6*  --   --   PHOS 4.4 4.7*  --   --   --  6.6*    Liver Function Tests: Recent Labs  Lab 05/24/19 0500 05/25/19 0500 05/29/19 0449  ALBUMIN 1.7* 1.7* 2.2*   No results for input(s): LIPASE, AMYLASE in the last 168 hours. No results for input(s): AMMONIA in the last 168 hours.  CBC: Recent Labs  Lab 05/25/19 0500 05/26/19 0500 05/26/19 1600 05/27/19 0500 05/29/19 0449  WBC 5.9 5.4 5.1 5.3 4.1  HGB 7.7* 7.9* 7.5* 7.8* 7.6*  HCT 25.1* 25.3* 24.6* 25.8* 25.2*  MCV 96.9 97.3 98.0 99.2 100.0  PLT 90* 80* 78* 94* 98*    Cardiac Enzymes: No results for input(s): CKTOTAL, CKMB, CKMBINDEX, TROPONINI in the last 168 hours.  BNP: Invalid input(s): POCBNP  CBG: No results for input(s): GLUCAP in the last 168 hours.  Microbiology: Results for orders placed or performed during the hospital encounter of 05/26/2019  SARS CORONAVIRUS 2 (TAT 6-24 HRS) Nasopharyngeal Nasopharyngeal Swab     Status: None   Collection Time: 05/28/19  2:39 PM   Specimen: Nasopharyngeal Swab  Result Value Ref Range Status   SARS Coronavirus 2 NEGATIVE NEGATIVE Final    Comment: (NOTE) SARS-CoV-2 target nucleic acids are NOT DETECTED. The SARS-CoV-2 RNA is generally detectable in upper and lower respiratory specimens during the acute phase of infection. Negative results do not preclude SARS-CoV-2 infection, do not rule out co-infections with other pathogens, and should not be used as the sole basis for treatment or other patient management decisions. Negative results must be combined with clinical observations, patient history, and epidemiological information. The expected result is Negative. Fact Sheet for Patients: SugarRoll.be Fact Sheet for Healthcare Providers: https://www.woods-mathews.com/ This test is not yet approved or cleared by the [place] FDA and  has been authorized  for detection and/or diagnosis of SARS-CoV-2 by FDA under an Emergency Use Authorization (EUA). This EUA will remain  in effect (meaning this test can be used) for the duration of the COVID-19 declaration under Section 56 4(b)(1) of the Act, 21 U.S.C. section 360bbb-3(b)(1), unless the authorization is terminated or revoked sooner. Performed at [facility], [place] [address], [place], [place] [address]     Coagulation Studies: Recent Labs    05/26/19 1430  LABPROT 13.9  INR 1.1    Urinalysis: No results for input(s): COLORURINE, LABSPEC, PHURINE, GLUCOSEU, HGBUR, BILIRUBINUR, KETONESUR, PROTEINUR, UROBILINOGEN, NITRITE, LEUKOCYTESUR in the last 72 hours.  Invalid input(s): APPERANCEUR    Imaging:  No results found.   Assessment & Plan: Pt is a 64 y.o. male with a PMHx of left arm wound with tendon injury, recent acute respiratory failure, acute metabolic encephalopathy, acute kidney injury, pneumonia, metabolic acidosis, diabetes mellitus type 2, chronic kidney disease stage III baseline creatinine 1.2, coronary artery disease status post CABG, hyperlipidemia,, hypertension, GERD, chronic  systolic heart failure who was admitted to [facility] on 06-06-19 for ongoing management.  1.  Acute kidney injury/chronic kidney disease stage II baseline creatinine 1.2.  Renal ultrasound was performed and was negative for hydronephrosis.  Does not appear to have sustained hypotension at the moment.  Suspect ATN most likely.  Patient has been hydrated over the past several days and is making urine.  Family does not want to proceed with dialysis.  Continue with gentle hydration for now.  2.  Acute respiratory failure.  Maintain the patient on ventilatory support.  3.  Hyperkalemia.  Serum potassium currently 5.2.  Patient has as needed Kayexalate written for.  4.  Thanks for consultation.

## 2019-05-29 DEATH — deceased

## 2019-05-30 ENCOUNTER — Telehealth: Payer: Self-pay | Admitting: *Deleted

## 2019-05-30 DIAGNOSIS — J9621 Acute and chronic respiratory failure with hypoxia: Secondary | ICD-10-CM | POA: Diagnosis not present

## 2019-05-30 DIAGNOSIS — A419 Sepsis, unspecified organism: Secondary | ICD-10-CM | POA: Diagnosis not present

## 2019-05-30 DIAGNOSIS — G9340 Encephalopathy, unspecified: Secondary | ICD-10-CM | POA: Diagnosis not present

## 2019-05-30 DIAGNOSIS — N183 Chronic kidney disease, stage 3 unspecified: Secondary | ICD-10-CM | POA: Diagnosis not present

## 2019-05-30 LAB — POTASSIUM: Potassium: 4.7 mmol/L (ref 3.5–5.1)

## 2019-06-29 NOTE — Telephone Encounter (Signed)
01-10-20/1100/was asked to have end of life prayers with patient.  Prayer of acceptance and peace read to patient.  Prayers for restful and peaceful end of life given.  No family present.  Patient barely responsive other than body movements.  respirations agonal.  Time spent with patient 15 minutes.   Marcelle Smiling, BSN,RN3,CCM,CN.

## 2019-06-29 NOTE — Progress Notes (Signed)
Pulmonary Critical Care Medicine [facility] [place]   PULMONARY CRITICAL CARE SERVICE  PROGRESS NOTE  Date of Service: 06/15/2019  CAYDAN Merritt  VZD:[id number]  DOB: [birth year]   DOA: 2019-05-28  Referring Physician: Carron Curie, MD  HPI: James Merritt is a 64 y.o. male seen for follow up of Acute on Chronic Respiratory Failure.  Patient is extubated remains on BiPAP has been on 14/8.  Family wants to make the patient comfort care at this point the chaplain was actually in today for end-of-life prayers  Medications: Reviewed on Rounds  Physical Exam:  Vitals: Temperature 99.4 pulse 72 respiratory rate 18 blood pressure is 103/68 saturations 97%  Ventilator Settings off the ventilator right now on BiPAP device 14/8  . General: Comfortable at this time . Eyes: Grossly normal lids, irises & conjunctiva . ENT: grossly tongue is normal . Neck: no obvious mass . Cardiovascular: S1 S2 normal no gallop . Respiratory: Coarse breath sounds noted . Abdomen: soft . Skin: no rash seen on limited exam . Musculoskeletal: not rigid . Psychiatric:unable to assess . Neurologic: no seizure no involuntary movements         Lab Data:   Basic Metabolic Panel: Recent Labs  Lab 05/24/19 0500 05/25/19 0500 05/26/19 0500 05/27/19 0500 05/28/19 1120 05/29/19 0449 06/24/2019 0758  NA 141 140 143 141 142 143  --   K 4.5 5.3* 4.7 5.1 5.2* 5.2* 4.7  CL 110 107 111 109 110 109  --   CO2 25 23 25 25 24 25   --   GLUCOSE 141* 197* 92 73 130* 133*  --   BUN 80* 93* 98* 109* 113* 112*  --   CREATININE 1.63* 2.06* 2.11* 2.46* 2.62* 2.73*  --   CALCIUM 8.0* 7.9* 8.1* 8.2* 8.2* 8.4*  --   MG 2.4 2.5* 2.4 2.6*  --   --   --   PHOS 4.4 4.7*  --   --   --  6.6*  --     ABG: No results for input(s): PHART, PCO2ART, PO2ART, HCO3, O2SAT in the last 168 hours.  Liver Function Tests: Recent Labs  Lab 05/24/19 0500 05/25/19 0500 05/29/19 0449  ALBUMIN 1.7* 1.7* 2.2*   No results  for input(s): LIPASE, AMYLASE in the last 168 hours. No results for input(s): AMMONIA in the last 168 hours.  CBC: Recent Labs  Lab 05/25/19 0500 05/26/19 0500 05/26/19 1430 05/26/19 1600 05/27/19 0500 05/29/19 0449  WBC 5.9 5.4  --  5.1 5.3 4.1  HGB 7.7* 7.9*  --  7.5* 7.8* 7.6*  HCT 25.1* 25.3*  --  24.6* 25.8* 25.2*  MCV 96.9 97.3  --  98.0 99.2 100.0  PLT 90* 80* 80* 78* 94* 98*    Cardiac Enzymes: No results for input(s): CKTOTAL, CKMB, CKMBINDEX, TROPONINI in the last 168 hours.  BNP (last 3 results) No results for input(s): BNP in the last 8760 hours.  ProBNP (last 3 results) No results for input(s): PROBNP in the last 8760 hours.  Radiological Exams: No results found.  Assessment/Plan Active Problems:   Acute on chronic respiratory failure with hypoxia (HCC)   Severe sepsis (HCC)   Chronic kidney disease, stage III (moderate)   Acute encephalopathy   1. Acute on chronic respiratory failure hypoxia the plan is to continue with supportive care comfort measures will be instituted instituted in accordance with the wishes of the family. 2. Severe sepsis hemodynamics right now unchanged 3. Chronic kidney disease supportive care  seen by nephrology 4. Acute encephalopathy no change   I have personally seen and evaluated the patient, evaluated laboratory and imaging results, formulated the assessment and plan and placed orders. The Patient requires high complexity decision making with multiple systems involvement.  Rounds were done with the Respiratory Therapy Director and Staff therapists and discussed with nursing staff also.  Allyne Gee, MD [facility] Pulmonary Critical Care Medicine Sleep Medicine

## 2019-06-29 DEATH — deceased

## 2021-04-28 IMAGING — DX DG CHEST 1V PORT
1 series · 1 of 1 positions shown · non-contrast
Comparison: None.

CLINICAL DATA: Pneumonia. NG tube placement.

EXAM:
PORTABLE CHEST 1 VIEW

[chest ap]
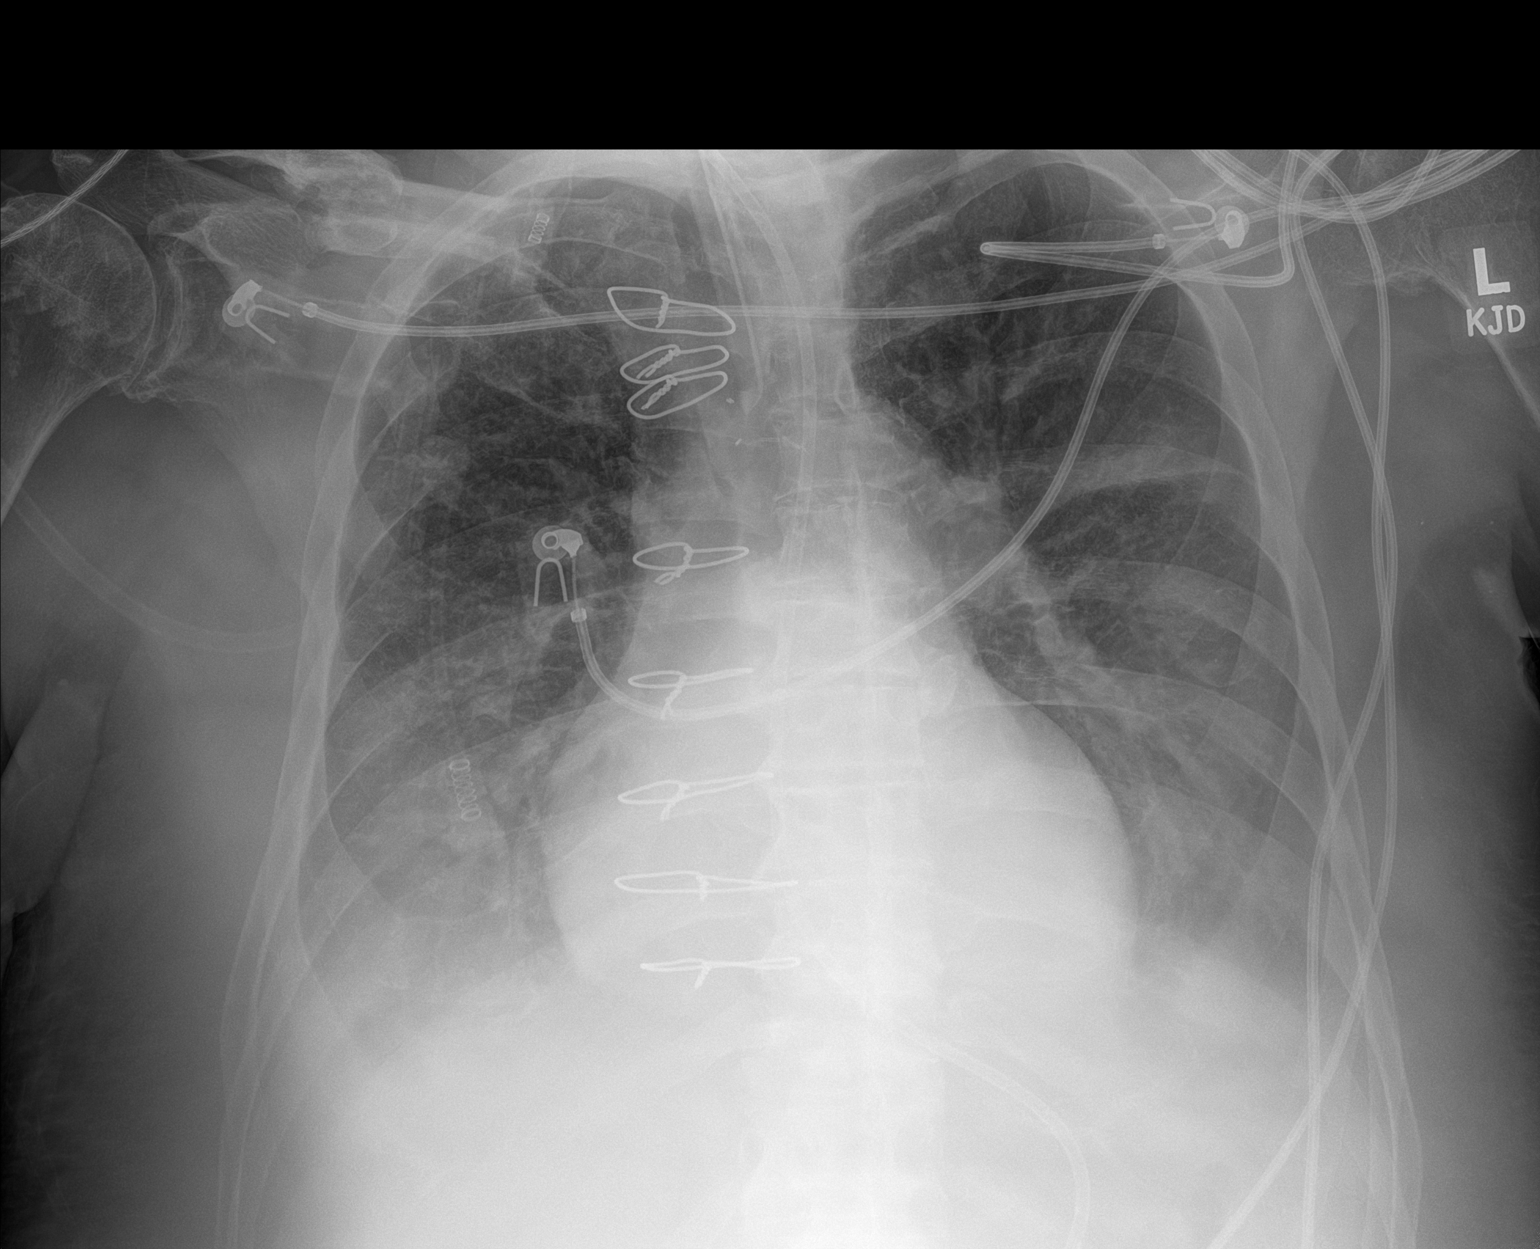

[1 of 1 positions shown; findings below may reference images not displayed]

FINDINGS: Endotracheal tube tip 3.8 cm from the carina. Enteric tube in place
with tip below the diaphragm not included in the field of view. Post
median sternotomy. Upper normal heart size. Hazy bilateral lung base
opacities likely combination of pleural effusions and airspace
disease. Focal opacity in the left mid lung. No pneumothorax. Remote
right clavicle fracture. Multiple overlying monitoring devices.
IMPRESSION: 1. Endotracheal tube tip 3.8 cm from the carina.
2. Enteric tube in place with tip below the diaphragm not included
in the field of view.
3. Hazy bilateral lung base opacities likely combination of pleural
effusions and airspace disease. Additional focal opacity in the left
mid lung suspicious for pneumonia.

## 2021-04-28 IMAGING — DX DG ABD PORTABLE 1V
1 series · 1 of 1 positions shown · non-contrast
Comparison: None.

CLINICAL DATA: NG tube placement.

EXAM:
PORTABLE ABDOMEN - 1 VIEW

[abdomen kub]
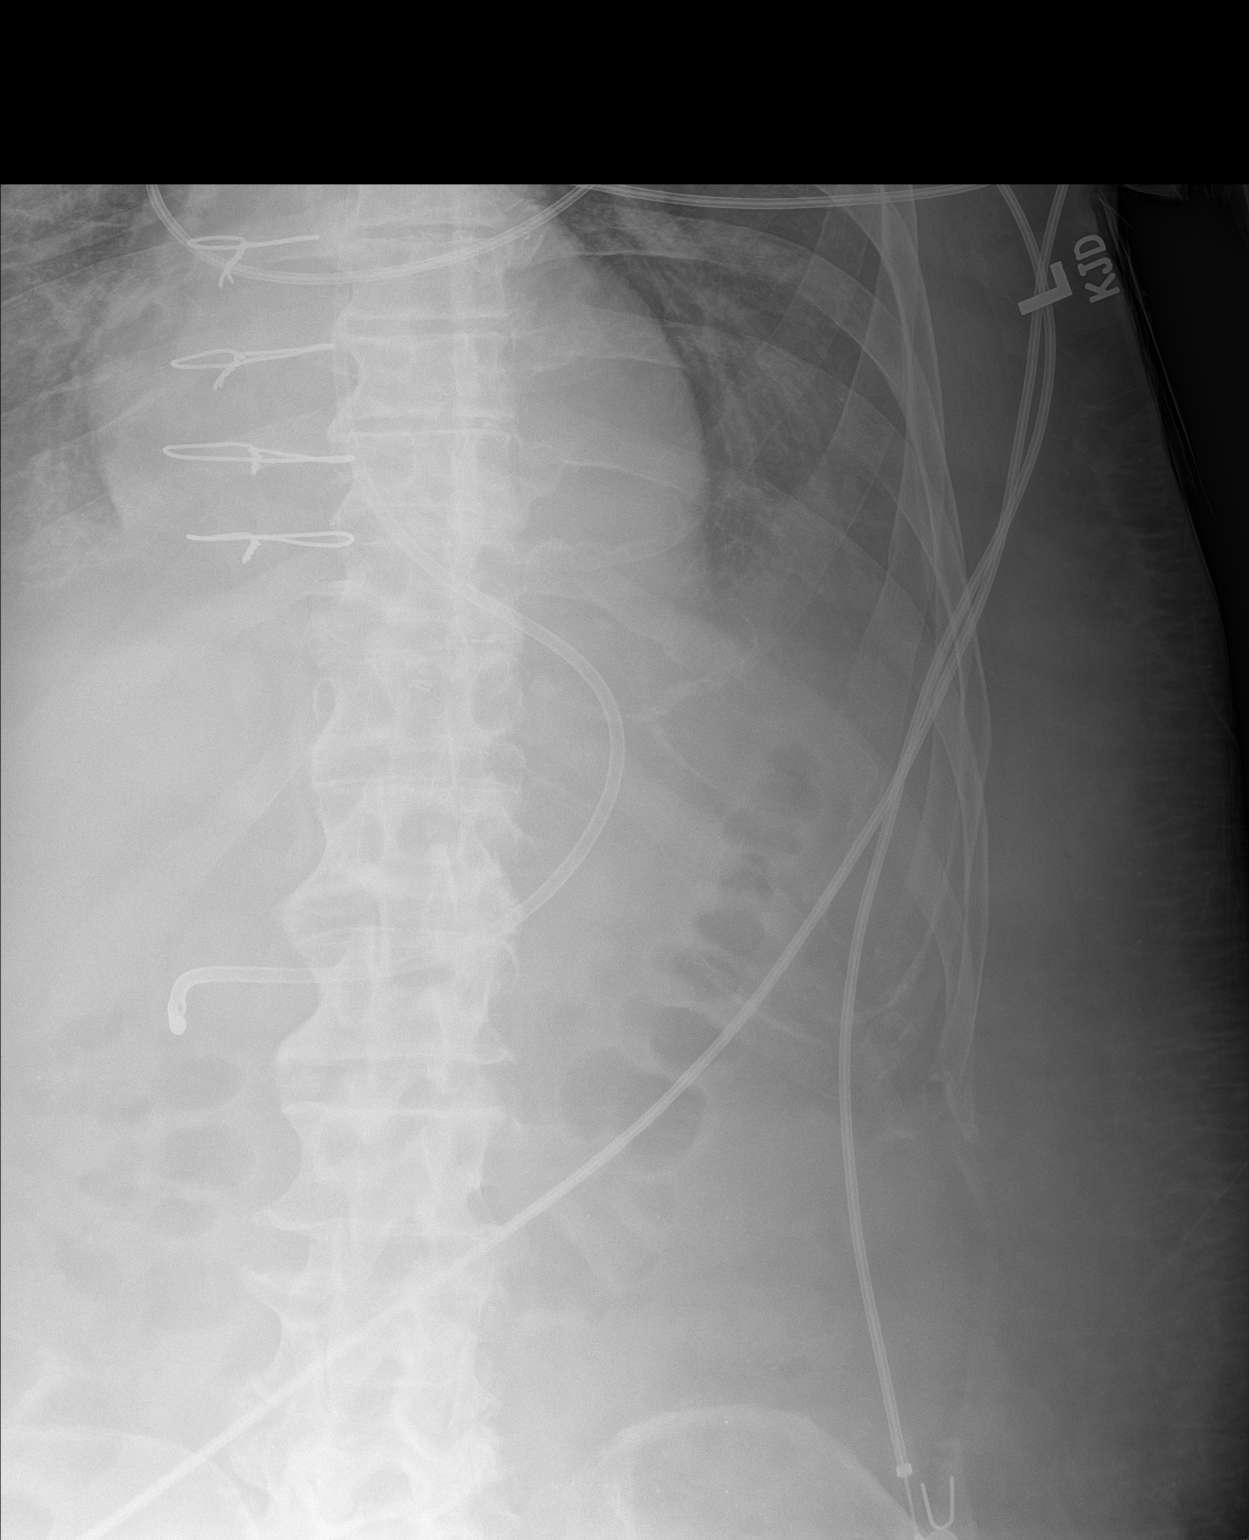

[1 of 1 positions shown; findings below may reference images not displayed]

FINDINGS: Tip of the weighted enteric tube in the right upper quadrant likely
in the first portion of the duodenum. Nonobstructive bowel gas
pattern. Small amount of air in nondilated colon.
IMPRESSION: Tip of the weighted enteric tube in the right upper quadrant likely
in the first portion of the duodenum.

## 2021-05-09 IMAGING — US US RENAL
1 series · 14 of 25 positions shown · non-contrast
Comparison: None.

CLINICAL DATA: Acute kidney injury

EXAM:
RENAL / URINARY TRACT ULTRASOUND COMPLETE

[Series 1: us renal · 34 acquisitions, 14 frames shown]
[im 1/34]
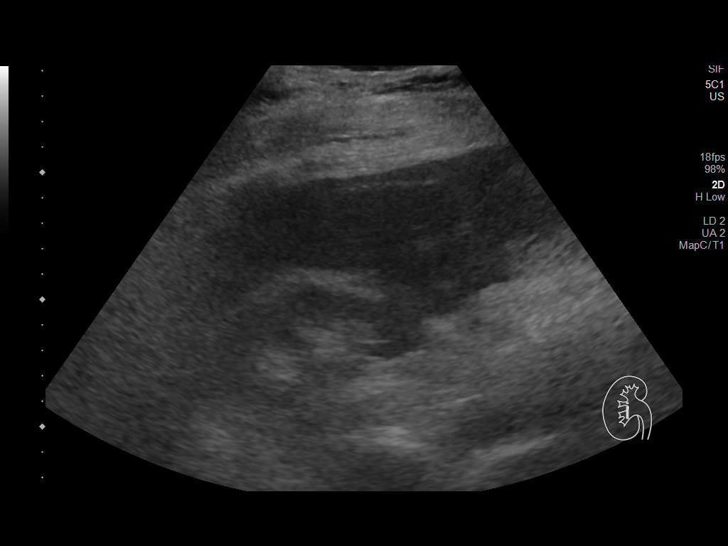
[im 3/34]
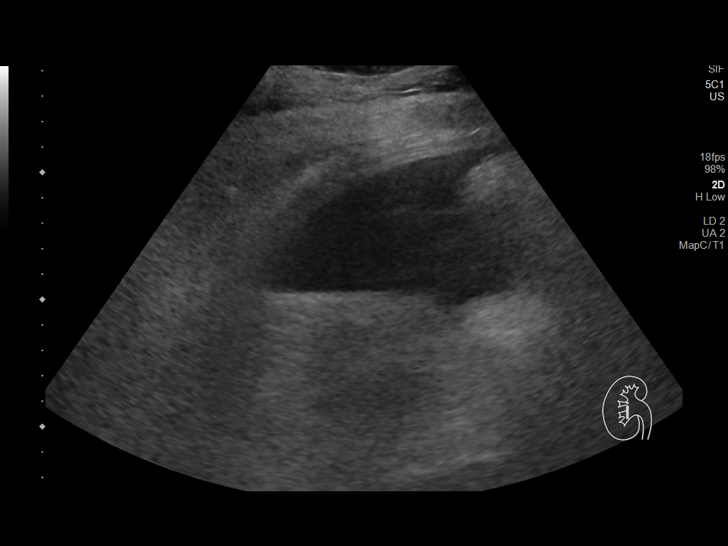
[im 6/34]
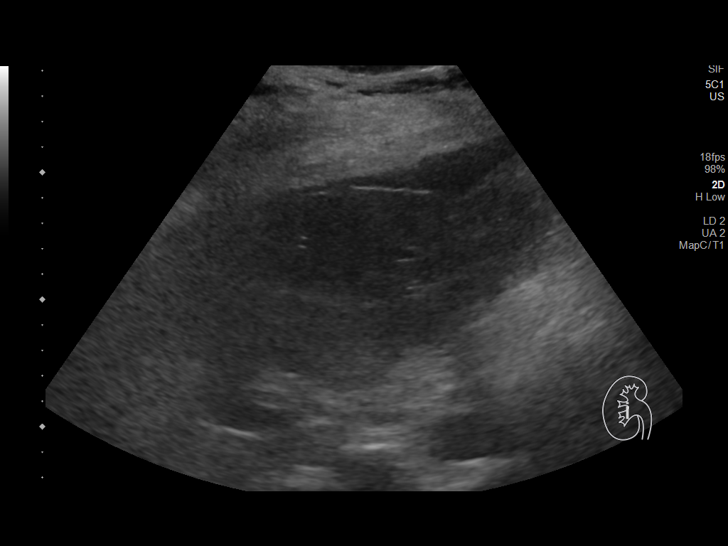
[im 9/34]
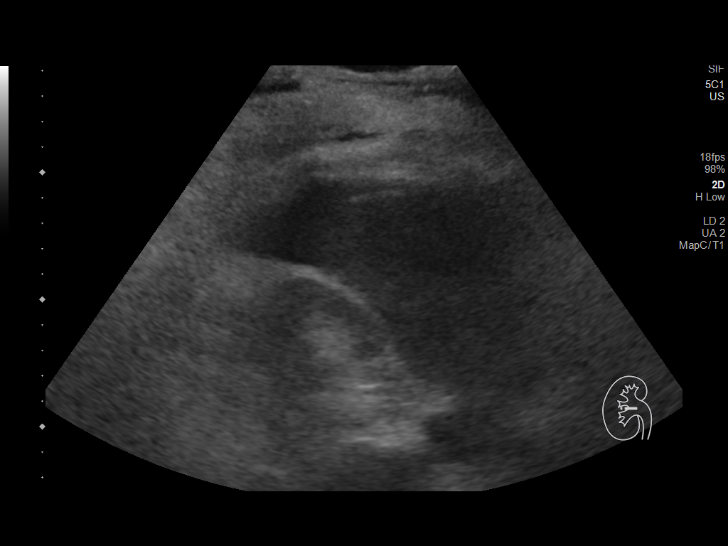
[im 12/34]
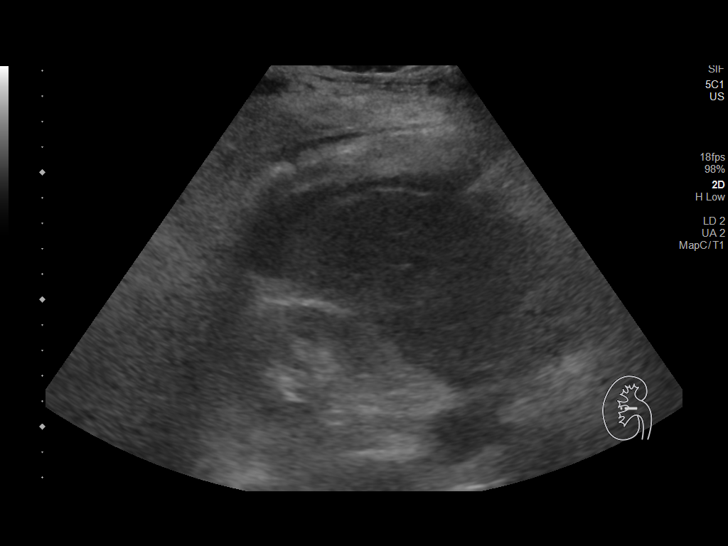
[im 13/34]
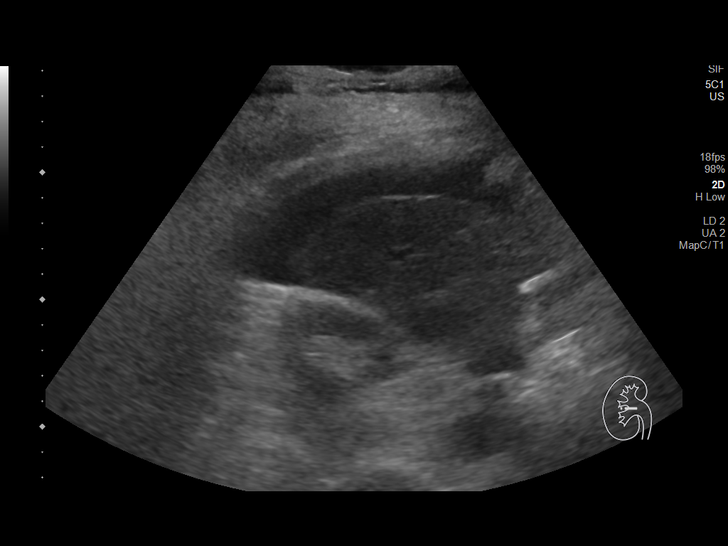
[im 16/34]
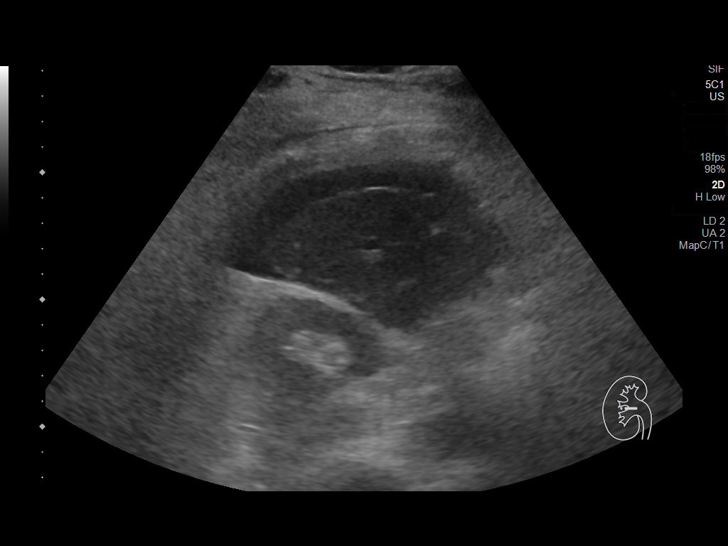
[im 18/34]
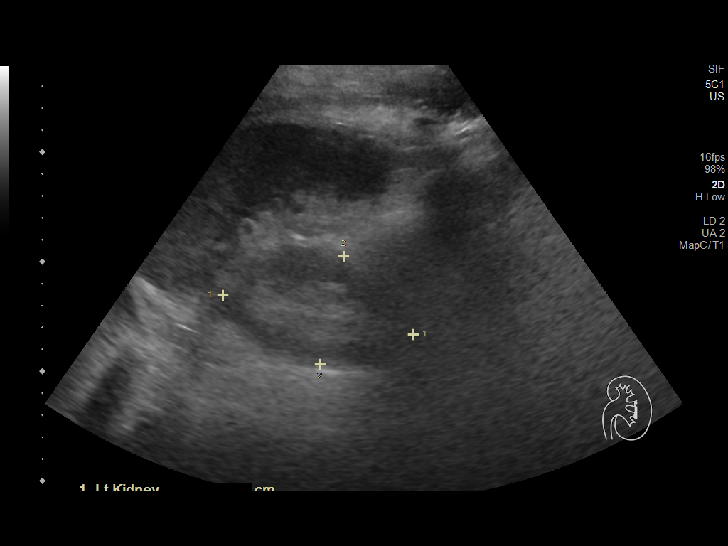
[im 21/34]
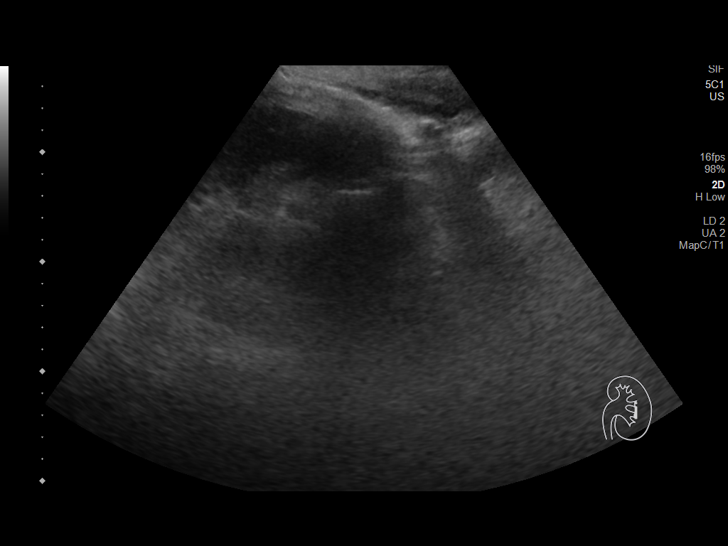
[im 23/34]
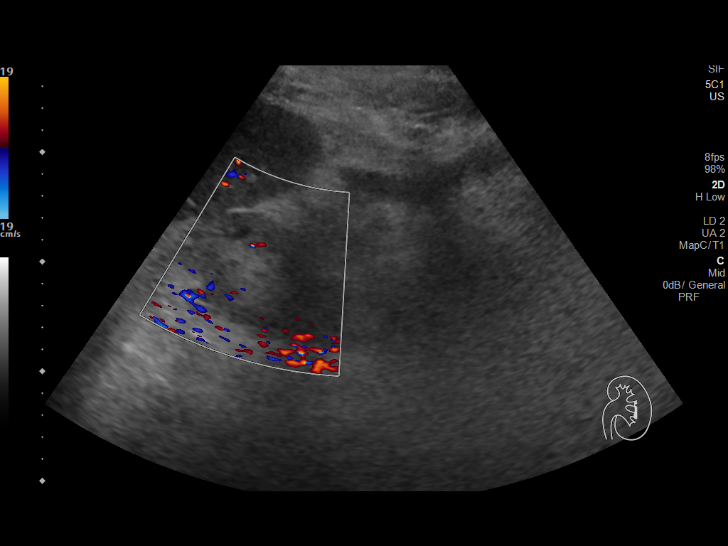
[im 25/34]
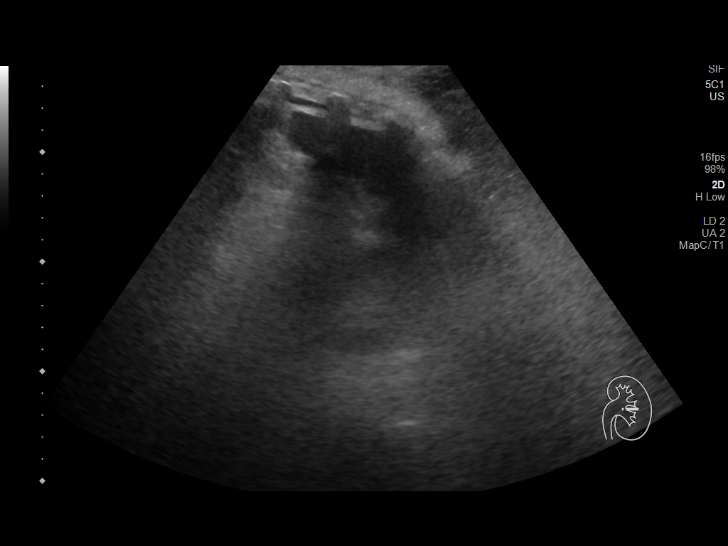
[im 28/34]
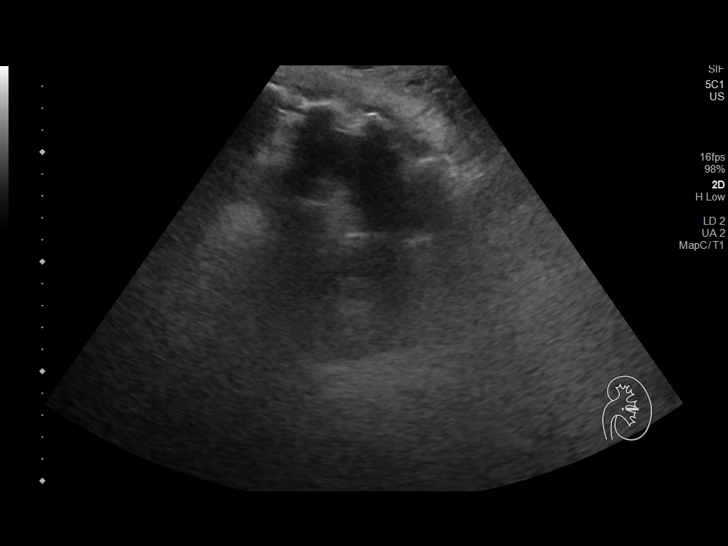
[im 31/34]
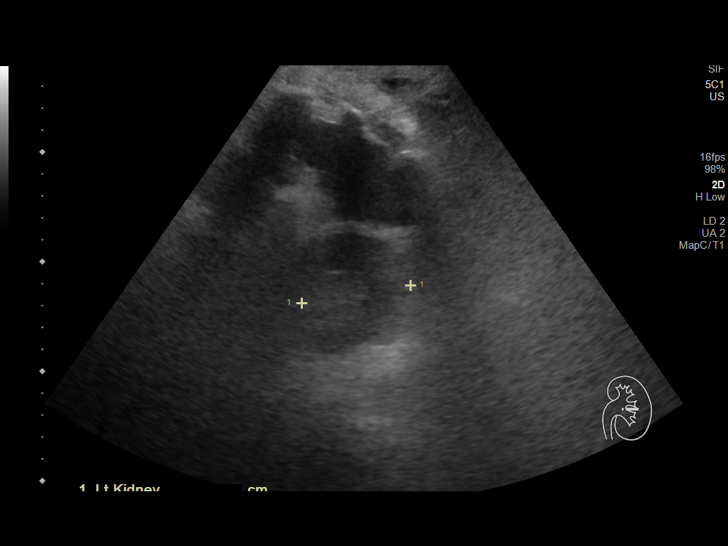
[im 34/34]
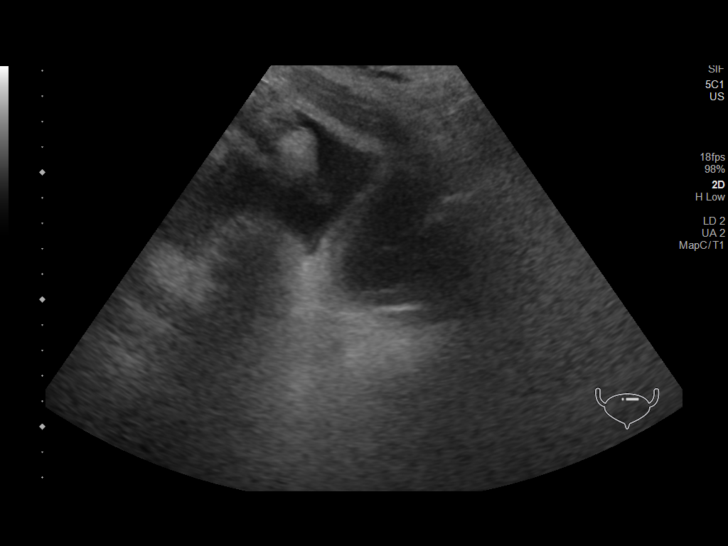

[14 of 25 positions shown; findings below may reference images not displayed]

FINDINGS: Right Kidney:

Renal measurements: 8.5 x 3.8 x 3.3 cm. = volume: 58 mL. There is
increased cortical echogenicity without evidence for hydronephrosis.

Left Kidney:

Renal measurements: 8.9 x 5 x 5 cm = volume: 117 mL. There is
increased cortical echogenicity without evidence for hydronephrosis.

Bladder:

The bladder is suboptimally evaluated secondary to the presence of a
Foley catheter.

Other:

A small volume of ascites is noted.
IMPRESSION: 1. No hydronephrosis.
2. Echogenic kidneys bilaterally which can be seen in patients with
medical renal disease.
3. Small volume abdominal ascites is noted.
